# Patient Record
Sex: Female | Born: 1990 | Race: White | Hispanic: No | Marital: Single | Smoking: Never smoker
Health system: Southern US, Community
[De-identification: ages and names within clinical notes are randomized; demographics above are authoritative.]

## PROBLEM LIST (undated history)

## (undated) ENCOUNTER — Telehealth

## (undated) ENCOUNTER — Encounter

## (undated) ENCOUNTER — Encounter: Payer: PRIVATE HEALTH INSURANCE | Attending: Dermatology | Primary: Dermatology

## (undated) ENCOUNTER — Encounter
Attending: Student in an Organized Health Care Education/Training Program | Primary: Student in an Organized Health Care Education/Training Program

## (undated) ENCOUNTER — Telehealth: Attending: Dermatology | Primary: Dermatology

## (undated) ENCOUNTER — Ambulatory Visit

## (undated) ENCOUNTER — Ambulatory Visit: Payer: BLUE CROSS/BLUE SHIELD | Attending: Physician Assistant | Primary: Physician Assistant

## (undated) ENCOUNTER — Encounter: Attending: Dermatology | Primary: Dermatology

## (undated) ENCOUNTER — Ambulatory Visit: Attending: Dermatology | Primary: Dermatology

## (undated) ENCOUNTER — Ambulatory Visit: Payer: BLUE CROSS/BLUE SHIELD

## (undated) DIAGNOSIS — L732 Hidradenitis suppurativa: Secondary | ICD-10-CM

## (undated) DIAGNOSIS — E079 Disorder of thyroid, unspecified: Secondary | ICD-10-CM

## (undated) DIAGNOSIS — M1990 Unspecified osteoarthritis, unspecified site: Secondary | ICD-10-CM

## (undated) HISTORY — PX: MANDIBLE FRACTURE SURGERY: SHX706

## (undated) HISTORY — PX: SINUS EXPLORATION: SHX5214

## (undated) HISTORY — DX: Disorder of thyroid, unspecified: E07.9

## (undated) HISTORY — DX: Hidradenitis suppurativa: L73.2

## (undated) HISTORY — DX: Unspecified osteoarthritis, unspecified site: M19.90

## (undated) MED ORDER — ESTRADIOL 0.01% (0.1 MG/GRAM) VAGINAL CREAM: 0.00000 days

## (undated) MED ORDER — CYCLOBENZAPRINE 5 MG TABLET: 0.00000 days

## (undated) MED ORDER — BUDESONIDE-FORMOTEROL HFA 80 MCG-4.5 MCG/ACTUATION AEROSOL INHALER: 0.00000 days

## (undated) MED ORDER — OLOPATADINE 0.6 % NASAL SPRAY: 0.00000 days

---
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
## 2017-05-14 ENCOUNTER — Emergency Department
Admission: EM | Admit: 2017-05-14 | Discharge: 2017-05-14 | Disposition: A | Discharge status: Home / Self Care | Payer: BC Managed Care – PPO | Source: Intra-hospital

## 2017-09-13 MED ORDER — SPIRONOLACTONE 100 MG TABLET
ORAL_TABLET | 1 refills | 0.00000 days | Status: CP
Start: 2017-09-13 — End: 2017-12-10

## 2017-12-10 ENCOUNTER — Encounter: Admit: 2017-12-10 | Discharge: 2017-12-11 | Payer: PRIVATE HEALTH INSURANCE

## 2017-12-10 DIAGNOSIS — L708 Other acne: Principal | ICD-10-CM

## 2017-12-10 DIAGNOSIS — L910 Hypertrophic scar: Secondary | ICD-10-CM

## 2017-12-10 DIAGNOSIS — L732 Hidradenitis suppurativa: Secondary | ICD-10-CM

## 2017-12-10 MED ORDER — SPIRONOLACTONE 100 MG TABLET
ORAL_TABLET | Freq: Every day | ORAL | 1 refills | 0.00000 days | Status: CP
Start: 2017-12-10 — End: 2018-02-04

## 2018-02-04 MED ORDER — SPIRONOLACTONE 100 MG TABLET
ORAL_TABLET | Freq: Every day | ORAL | 1 refills | 0.00000 days | Status: CP
Start: 2018-02-04 — End: 2018-07-15

## 2018-02-19 ENCOUNTER — Encounter: Admit: 2018-02-19 | Discharge: 2018-02-20 | Payer: PRIVATE HEALTH INSURANCE

## 2018-02-19 DIAGNOSIS — L710 Perioral dermatitis: Secondary | ICD-10-CM

## 2018-02-19 DIAGNOSIS — L732 Hidradenitis suppurativa: Principal | ICD-10-CM

## 2018-02-19 DIAGNOSIS — L910 Hypertrophic scar: Secondary | ICD-10-CM

## 2018-02-19 MED ORDER — AZITHROMYCIN 250 MG TABLET
ORAL | 2 refills | 0.00000 days | Status: CP
Start: 2018-02-19 — End: 2018-05-20

## 2018-07-15 MED ORDER — SPIRONOLACTONE 100 MG TABLET
ORAL_TABLET | Freq: Every day | ORAL | 1 refills | 0.00000 days | Status: CP
Start: 2018-07-15 — End: 2019-01-12

## 2019-01-12 MED ORDER — SPIRONOLACTONE 100 MG TABLET
ORAL_TABLET | 3 refills | 0.00000 days | Status: CP
Start: 2019-01-12 — End: ?

## 2019-01-27 MED ORDER — KETOCONAZOLE 2 % SHAMPOO
TOPICAL | 11 refills | 0.00000 days | Status: CP
Start: 2019-01-27 — End: ?

## 2019-03-13 ENCOUNTER — Encounter
Admit: 2019-03-13 | Discharge: 2019-03-14 | Payer: PRIVATE HEALTH INSURANCE | Attending: Dermatology | Primary: Dermatology

## 2019-03-13 DIAGNOSIS — L409 Psoriasis, unspecified: Principal | ICD-10-CM

## 2019-03-13 MED ORDER — CLOBETASOL 0.05 % SCALP SOLUTION
Freq: Two times a day (BID) | TOPICAL | 3 refills | 0.00000 days | Status: CP
Start: 2019-03-13 — End: 2020-03-12

## 2019-04-09 ENCOUNTER — Encounter
Admit: 2019-04-09 | Discharge: 2019-04-10 | Payer: PRIVATE HEALTH INSURANCE | Attending: Dermatology | Primary: Dermatology

## 2019-04-09 DIAGNOSIS — L409 Psoriasis, unspecified: Secondary | ICD-10-CM

## 2019-04-09 DIAGNOSIS — Z79899 Other long term (current) drug therapy: Principal | ICD-10-CM

## 2019-04-09 DIAGNOSIS — L710 Perioral dermatitis: Secondary | ICD-10-CM

## 2019-04-09 MED ORDER — METRONIDAZOLE 0.75 % TOPICAL CREAM
Freq: Two times a day (BID) | TOPICAL | 6 refills | 0.00000 days | Status: CP
Start: 2019-04-09 — End: ?

## 2019-04-09 MED ORDER — HUMIRA 40 MG/0.8 ML SUBCUTANEOUS SYRINGE KIT
SUBCUTANEOUS | 4 refills | 0.00000 days | Status: CP
Start: 2019-04-09 — End: 2019-04-14

## 2019-04-09 MED ORDER — HUMIRA 40 MG/0.8 ML SUBCUTANEOUS SYRINGE KIT: each | 4 refills | 0 days | Status: AC

## 2019-04-10 LAB — HIV ANTIGEN/ANTIBODY COMBO: HIV 1+2 Ab+HIV1 p24 Ag:PrThr:Pt:Ser/Plas:Ord:IA: NONREACTIVE

## 2019-04-10 LAB — HEPATITIS B CORE TOTAL ANTIBODY: Hepatitis B virus core Ab:PrThr:Pt:Ser/Plas:Ord:IA: NONREACTIVE

## 2019-04-10 LAB — HEPATITIS C ANTIBODY
HEPATITIS C ANTIBODY: NONREACTIVE
Hepatitis C virus Ab:PrThr:Pt:Ser:Ord:: NONREACTIVE

## 2019-04-10 LAB — HEPATITIS B SURFACE ANTIBODY: HEPATITIS B SURFACE ANTIBODY QUANT: >1000 m[IU]/mL — ABNORMAL HIGH (ref <8.00)

## 2019-04-10 LAB — HEPATITIS B SURFACE ANTIGEN: Hepatitis B virus surface Ag:PrThr:Pt:Ser:Ord:: NONREACTIVE

## 2019-04-10 LAB — HEPATITIS B SURFACE ANTIBODY QUANT: Hepatitis B virus surface Ab:ACnc:Pt:Ser:Qn:: >1000 — ABNORMAL HIGH

## 2019-04-10 NOTE — Unmapped (Signed)
Per test claim for HUMIRA at the [facility], patient needs Medication Assistance Program for Prior Authorization.

## 2019-04-10 NOTE — Unmapped (Signed)
lvm

## 2019-04-14 LAB — TB MITOGEN VALUE: Lab: 6.06

## 2019-04-14 LAB — QUANTIFERON TB GOLD PLUS
Lab: NEGATIVE
QUANTIFERON MITOGEN: 6.04 [IU]/mL
QUANTIFERON TB GOLD PLUS: NEGATIVE
QUANTIFERON TB NIL VALUE: 0.02 [IU]/mL

## 2019-04-14 LAB — TB AG1 VALUE: Lab: 0.09

## 2019-04-14 LAB — TB AG2 VALUE: Lab: 0.08

## 2019-04-14 LAB — TB NIL VALUE: Lab: 0.02

## 2019-04-14 MED ORDER — HUMIRA 40 MG/0.8 ML SUBCUTANEOUS SYRINGE KIT
SUBCUTANEOUS | 4 refills | 7.00000 days | Status: CP
Start: 2019-04-14 — End: 2019-04-27

## 2019-04-14 NOTE — Unmapped (Signed)
Received VM from Teresa Butler saying her pharmacy was missing information on the prescription. I called [phone] per her request. I called [facility]. They verified the prescription with me and everything was correct so not sure what the issue was. I will call Prentiss and let her know.

## 2019-04-15 NOTE — Unmapped (Signed)
Patient labs stable. Patient notified via mychart.

## 2019-04-15 NOTE — Unmapped (Signed)
I took care of this yesterday as Ruby had also left a VM on the nurse line. Your prescription was fine. I don't know what their issue was, but I verified everything.

## 2019-04-22 NOTE — Unmapped (Signed)
Teresa Butler wants to know if she could switch to the citrate free Humira pens.

## 2019-04-24 MED ORDER — HUMIRA PEN CITRATE FREE STARTER PACK FOR PS/UV 1X 80 MG/0.8 ML, 2X 40 MG/0.4 ML
PACK | ORAL | 0 refills | 0.00000 days | Status: CP
Start: 2019-04-24 — End: 2019-04-27

## 2019-04-24 MED ORDER — HUMIRA PEN CITRATE FREE 40 MG/0.4 ML
SUBCUTANEOUS | 6 refills | 28.00000 days | Status: CP
Start: 2019-04-24 — End: 2019-04-27

## 2019-04-24 NOTE — Unmapped (Signed)
Does the citrate free come in a syringe? Teresa Butler prefers the syringes.

## 2019-04-27 MED ORDER — ADALIMUMAB SYRINGE CITRATE FREE 40 MG/0.4 ML
INJECTION | SUBCUTANEOUS | 3 refills | 0.00000 days | Status: CP
Start: 2019-04-27 — End: ?

## 2019-04-27 NOTE — Unmapped (Signed)
Just wanted to send you this NVM note in case you were working on this.  Thanks, Fannie Knee

## 2019-04-28 NOTE — Unmapped (Signed)
Received fax from [facility] saying Marcelle requested replacement pen. It requires your signature. Will place in your mailbox.

## 2019-05-05 NOTE — Unmapped (Signed)
Call from patient.  States she was asked by [facility] to call Dr. Carles Collet to have her call them with one time order for replacement Humira pen.  Patient says she needs it by next Wednesday when her next inj is due.  Phone number for [facility] is [phone].

## 2019-05-06 NOTE — Unmapped (Signed)
I called [facility] and gave a verbal order for Fonda to receive her replacement Humira.

## 2019-05-07 NOTE — Unmapped (Signed)
Patient called in on nurse line asking if Humira needs to be held prior to surgery she is to have to remove hardware in jaw.

## 2019-06-10 NOTE — Unmapped (Signed)
Per Danne Harbor and Dr. Carles Collet, I called [facility] at [phone]. They had a processed claim of 2 syringes sent on July 14th and 2 syringes sent on July 27th. They said the next shipment was not due until September 29th so a PLE was note needed as the shipment was requested too soon.     I went ahead and initiated the PA since her current one expires in less than 30 days. Verbal PA initiated for Humira. Approved through 06/09/20. Case #[id number].    She then transferred me to [facility] so I could discuss the shipment issue. The rep says she received 2 syringes on July 21st and 2 syringes on July 29th. This would still make her short one dose if she doesn't get a shipment until September 29th. Rep requested a new refill. She said it will take 24-48 hours to process this request.      Will send Mercer a MyChart message to update her on this.    Overall time on this phone call was 30 minutes.

## 2019-06-25 NOTE — Unmapped (Signed)
PA initiated for Humira 40MG /0.8ML syringe kit via CMM. Key: [id number] ??? PA Case ID: [id number]

## 2019-07-15 NOTE — Unmapped (Signed)
Voicemail left to call and schedule appt with Dr.Pearlstein. See Recall.

## 2019-08-05 DIAGNOSIS — L409 Psoriasis, unspecified: Principal | ICD-10-CM

## 2019-08-05 NOTE — Unmapped (Signed)
Called, left voicemail

## 2019-08-06 MED ORDER — ADALIMUMAB SYRINGE CITRATE FREE 40 MG/0.4 ML
INJECTION | SUBCUTANEOUS | 3 refills | 0.00000 days | Status: CP
Start: 2019-08-06 — End: ?

## 2019-10-26 ENCOUNTER — Encounter: Admit: 2019-10-26 | Discharge: 2019-10-27 | Payer: PRIVATE HEALTH INSURANCE

## 2019-10-26 DIAGNOSIS — Z79899 Other long term (current) drug therapy: Principal | ICD-10-CM

## 2019-10-26 DIAGNOSIS — L732 Hidradenitis suppurativa: Principal | ICD-10-CM

## 2019-10-26 DIAGNOSIS — L409 Psoriasis, unspecified: Principal | ICD-10-CM

## 2019-10-26 DIAGNOSIS — L299 Pruritus, unspecified: Principal | ICD-10-CM

## 2019-10-26 MED ORDER — GABAPENTIN 300 MG CAPSULE
ORAL_CAPSULE | Freq: Three times a day (TID) | ORAL | 3 refills | 60 days | Status: CP
Start: 2019-10-26 — End: ?

## 2019-10-26 MED ORDER — DOXEPIN 25 MG CAPSULE
ORAL_CAPSULE | Freq: Every evening | ORAL | 3 refills | 30 days | Status: CP
Start: 2019-10-26 — End: ?

## 2019-10-26 MED ORDER — ADALIMUMAB SYRINGE CITRATE FREE 40 MG/0.4 ML
INJECTION | 11 refills | 0 days | Status: CP
Start: 2019-10-26 — End: ?

## 2019-10-26 NOTE — Unmapped (Signed)
DERMATOLOGY FOLLOW-UP NOTE    Assessment and Plan:    Severe scalp psoriasis - failed clobetasol solution and improved with adalimumab 40mg  eow, but still has thick scale on some areas of the scalp  -increase Humira to 40mg  weekly, side effects reviewed     High risk medication use - humira  Discussed risks of infection, potential risk of malignancy, injection site reactions. No hx of CHF, MS, lupus, malignancy, hepatitis, TB. 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 Pt does not have sensation in this area related to hx of head injury/surgery  - start metroNIDAZOLE (METROCREAM) 0.75 % cream; Apply topically Two (2) times a day.  Dispense: 60 g; Refill:   -when flaring will plan to swab for hsv. Pt does not want to try empiric valtrex    Hidradenitis suppurativa and acne - well controlled on spironolactone 100 mg daily and adalimumab as above    Diffuse itching: increase gabapentin to 300mg  po twice daily, side effects reviewed  -doxepin 25mg  at bedtime prn      RTC 4 months  ______________________________________________________________________    CC:    Chief Complaint   Patient presents with   ??? HS     doing well in groin area and back of legs take humira not sure to cint spiralactone   ??? Psoriasis     humira refilled scalp doing good had bad flare a few weeks ago         HPI:  This is a pleasant 29 y.o. female last seen by myself a few weeks ago here for f/up of psoriasis  -scalp improved with adalimumab 40mg  eow, but still has thick areas that are tender on occiput. Clobetasol solution was not helpful.  -Also gets an intermittent rash on her chin. 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 States it occur every time after direct sunlight and always in the same location. Had an aerobic culture with her PCP which showed skin flora per patinet.    -states HS is well controlled with her spironolactone, acne also doing well with it  -also points out diffuse itching without rash most of the time on arms, legs, trunk.  Starting allergy shots and uses Xyzal daily, but doesn't seem to help the itch.  Also taking gabapentin 100mg  daily for unrelated issues without relief.    Pertinent PMH:  Reviewed in Epic      ROS: Baseline state of health.  Denies fevers, chills. No other skin complaints except noted per HPI.     PE:  Gen: WD, WN, NAD, A&O  Skin: Per patient request, examination of the head, neck, chest, back, abdomen, bilateral upper extremities,and bilateral lower extremities was performed and significant for the below. All other areas examined were normal or had no significant findings.   Scaly erythematous well-demarcated plaques on the occipital scalp  Photo reviewed of many of grouped pustules on the entire chin

## 2019-10-26 NOTE — Unmapped (Signed)

## 2019-11-03 DIAGNOSIS — L409 Psoriasis, unspecified: Principal | ICD-10-CM

## 2019-11-03 NOTE — Unmapped (Signed)
The provider has sent this medication to the pharmacy with 11 refills on 10/26/19. I will contact the patient and let her know she has further refills.

## 2019-11-05 NOTE — Unmapped (Signed)
PA approval for humira through 11/04/22.

## 2019-11-06 NOTE — Unmapped (Signed)
I'll respond as her PA was approved.

## 2019-11-11 ENCOUNTER — Ambulatory Visit: Admit: 2019-11-11 | Discharge: 2019-11-12 | Payer: PRIVATE HEALTH INSURANCE

## 2019-11-11 DIAGNOSIS — L732 Hidradenitis suppurativa: Principal | ICD-10-CM

## 2019-11-11 NOTE — Unmapped (Signed)
DERMATOLOGY CLINIC NOTE      A/P:    Hidradenitis suppurativa - well controlled today  - cont humira 40mg  weekly for psoriasis - likely also helping with HS  - laser today as below    Laser Procedure Note:   Medically necessary due to scar, pain with functional impairment during flares. Clear improvement with prior treatment.  10th treatment in groin,   9th treatment in axillae,   5th treatment on posterior thighs  Treatment carried out with laser today. ??Risks, benefits, alternatives, and expected numbder of treatments were discussed. Risks discussed included the low risk of scarring, brusing, discomfort, pigmentary alteration, erosion, or ulceration and verbal informed consent was obtained. ??  ??  Post-care instructions including careful sun-avoidance was discussed. ??Eye protection was placed.  ??  Sites treated: Inguinal folds, mons pubis, labia, bilateral axillae  Laser used: Alexandrite  Pulse duration: 5  Energy in J/cm2: 10 (11 for labia)  Spot size in mm2: 24  Size >50cm2      Return in about 6 weeks (around 12/23/2019) for Alex laser. or sooner as needed      CC:  Chief Complaint   Patient presents with   ??? HS       HPI:  29 y.o. female last seen by Dr Janyth Contes on 10/26/2019.  They present today for follow up.     Concerns today include:    Would like to resume laser for HS. Has done multiple sessions in the past and found it very efficacious. No major flares in recent past, though has noticed a difference in the months she has not been doing laser.    Also recently increased frequency of humira to 40mg  weekly for psoriasis and tolerating well      How often in pain: less than monthly  Requiring pain medication? NO  Frequency of bandage changes? no drainage or less than weekly  Drainage: not answered  >25% worsening in last 3 months? NO   If no, when was it last markedly worse? within last year  How often getting new lesions? less than monthly  Number of inflammatory lesions monthly: 1-3    DLQI: 2    Any new health conditions since last visit? asthma / eczema / allergies    How helpful is the current treatment in managing the following aspects of your disease?  Pain: Very helpful  Decreasing length of flares: Very helpful  Decreasing new lesions: Very helpful  Drainage: Very helpful  Decreasing frequency of flares: Very helpful  Decreasing severity of flares: Very helpful  Odor: Very helpful          The patient denies any other new or changing lesions or areas of concern.     Pertinent PMH: Reviewed in Epic.     Patient Active Problem List   Diagnosis   ??? Allergic rhinitis due to pollen   ??? Chronic maxillary sinusitis   ??? Chronic sinusitis   ??? Hip pain, bilateral   ??? Infective otitis externa   ??? Knee pain, bilateral   ??? Migraine without status migrainosus, not intractable   ??? Nasal obstruction   ??? S/P nasal septoplasty   ??? S/P tonsillectomy   ??? Tonsillitis   ??? Tonsillitis, chronic   ??? Post concussive syndrome   ??? Cocaine use, unspecified, uncomplicated   ??? Alcohol use   ??? Benzodiazepine misuse   ??? Emotional lability   ??? Head injury   ??? Major depressive disorder  ROS: Baseline state of health. No fever, no other skin lesions except as noted in HPI    PE:  General: Well-developed, well-nourished, in no acute distress  Neuro: Alert and oriented, interacts appropriately  Mouth: not examined as patient wearing a mask  Ext: no inflammatory or dystrophic changes of nails  Skin: Per patient request, focused examination with inspection and palpation of the head, neck, abdomen,  buttocks,  external genitalia,  right upper extremity, left upper extremity, right lower extremity, left lower extremity, was performed and notable for the following. All other areas examined were normal or had no significant findings.  - axillae clear with reduced density of hair  - noninflamed nodules in inguinal creases

## 2019-11-11 NOTE — Unmapped (Addendum)
Click Here to Visit [facility] Covid-19 Vaccine Hub  Get the latest facts on the COVID-19 vaccines.      Or visit [facility]???s COVID-19 Vaccine Hub at [email or web address] to review the latest facts about the vaccines.      It was nice to see you today! Your resident doctor was Dr. Harrison Mons. Please call our clinic at [phone] with any concerns. We look forward to seeing you again!    If any of your medications are too expensive, look for a coupon at [email or web address].  - Enter the medication name, size, and your zip code to find coupons for local pharmacies.   - Print a coupon and bring it to the pharmacy, or pull up the coupon on a smartphone.  - You can also call pharmacies to ask about the cost of your medication before you pick it up.  If you still cannot afford your medication or have any trouble getting them, please let us know.    Laser Hair Reduction     Types of Lasers and How They Work   Lasers send a low-energy light beam through the skin that is absorbed by dark pigment (melanin) present in the shaft of the hair follicles. Since hair cycles as it grows, repeated treatments are necessary to destroy about 80% of the hairs. Different types of lasers may be used. The ruby, alexandrite, and diode were the first lasers approved for hair reduction. The intense pulsed light (IPL) system is also used. These lasers work best on light-skinned, dark-haired individuals because dark pigments in the surrounding skin cannot absorb the light they emit. Therefore, we use lasers with longer wavelengths, such as the Nd:YAG lasers, to treat darker skin types including African-american skin.     Laser Consultation   At the laser consultation, the physician will determine the appropriate laser/light source and treatment settings based on:   -Skin type (i.e. Your sensitivity to sun exposure)   -Hair color   -Thickness and location of hair   -Presence of tan (we will not apply a laser or light source to sun-tanned skin) -Previous hair removal methods you have tried   -Medical history: ovarian or thyroid disease, medications, history of abnormal scarring, history of cold sore (herpes simplex) outbreaks in the treatment area, or past isotretinoin use   -Existence of tattoos or mole sin the treatment area   Multiple treatment sessions (typically 5-8) will be needed for long-term results and there is a potential need for maintenance treatments (patients with lightly pigmented hair as opposed to dark hairs), and the possibility of variable responses to treatment (not all patients respond to laser therapy).     Pretreatment Instructions   Before treatment, avoid tanning and sunless tanners. Broad-spectrum (UVA/UVB) sunscreens with SPF 30 or higher should be used. No plucking, waxing, or electrolysis should be done, although shaving or depilatory creams can be used. The site to be treated should be shaved one to two days prior to laser treatment. A prophylactic (protective) oral antiviral medication may be started on the day prior to treatment to suppress the possibility of developing a herpes simplex infection in the treatment area (please tell your physician if you have frequent herpes outbreaks). An oral antibiotic may be prescribed if the nasal or perianal skin is to be treated.     Laser Treatment   On the day of treatment, the area should be clean and free of cosmetics. A cooling device in the form of a cool gel or refrigerant  spray will be used to lessen discomfort and protects the skin from excessive heating as well as the potential of skin darkening or lightening. Everyone in the room must wear protective eyewear during the laser procedure. Darker hair responds better than lighter hair (white, gray, or red). The laser pulses will feel like the snapping of a rubber band or warm pinpricks.     After Treatment Instructions   A small amount of swelling and redness around the hair follicles typically appears within minutes. Ice packs may be applied to the skin following treatment, and over-the-counter pain relief medicine (Tylenol) may be taken as needed. If blistering occurs, notify your doctor and a topical antibiotic ointment should be applied twice daily until healed. A mild topical steroid cream may be applied to reduce swelling and redness if requested. You should avoid sun-exposure and to use a broad-spectrum (UVA/UVB) sunscreen with SPF 30 after each laser treatment. Cosmetics may be applied to the treated skin immediately after the procedure.     Side Effects   Side effects of laser hair removal treatments may include pain, perifollicular edema (swelling around the hair follicle), and erythema (redness and inflammation) lasting one to three days. Blistering, herpes simplex outbreaks, and bacterial infections also can occur. Temporary skin lightening or darkening, especially in darker skin types may also be seen. However, permanent skin pigment change or scarring is very rare. Loss of freckles or lightening of moles in the treatment area may occur, as can darkening or lightening of tattoos.     Efficacy   The percentage of hairs removed per session by body location, with areas of thin skin (bikini and armpits) generally showing a better response than thick skin (back and chin). Approximately 10-25 percent reduction in hair can be expected after each treatment. Treatments are repeated every four to eight weeks and hair that re-grows tends to be lighter and finer in texture.

## 2019-11-12 NOTE — Unmapped (Signed)
I saw and evaluated the patient, participating in the key elements of the service.  I discussed the findings, assessment and plan with the resident and agree with resident’s findings and plan as documented in the resident's note.  I was immediately available for the entirety of the procedure(s) and present for the key and critical portions. Shantell Belongia J Eithel Ryall, MD

## 2019-12-22 ENCOUNTER — Encounter: Admit: 2019-12-22 | Discharge: 2019-12-23 | Payer: PRIVATE HEALTH INSURANCE

## 2019-12-22 DIAGNOSIS — L732 Hidradenitis suppurativa: Principal | ICD-10-CM

## 2019-12-22 NOTE — Unmapped (Signed)
DERMATOLOGY CLINIC NOTE      A/P:    Hidradenitis suppurativa - well controlled today  - cont humira 40mg  weekly for psoriasis - likely also helping with HS  - laser today as below    Laser Procedure Note:   Medically necessary due to scar, pain with functional impairment during flares. Clear improvement with prior treatment.  11th treatment in groin,   10th treatment in axillae,   6th treatment on posterior thighs  Treatment carried out with laser today. Risks, benefits, alternatives, and expected numbder of treatments were discussed. Risks discussed included the low risk of scarring, brusing, discomfort, pigmentary alteration, erosion, or ulceration and verbal informed consent was obtained. ??  ??  Post-care instructions including careful sun-avoidance was discussed. ??Eye protection was placed.  ??  Sites treated: Inguinal folds, mons pubis, labia, bilateral axillae, posterior thighs  Laser used: Alexandrite  Pulse duration: 5  Energy in J/cm2: 10 (11 for labia and posterior thighs)  Spot size in mm2: 24  Size >50cm2, >15 follicles destroyed    Return in about 8 weeks (around 02/16/2020) for laser hair removal. or sooner as needed      CC:  Laser for HS    HPI:  29 y.o. female last seen by Dr Janyth Contes in 2/21 for laser hair removal for HS and returns today for the same.    She reports that overall she has done well with the laser hair removal. She does have residual hair growth on the labia, more so than on other areas that have been treated. She also notes that she has had a few small flares on her posterior thighs, but overall has been doing well.    The patient denies any other new or changing lesions or areas of concern.     Pertinent PMH:   Psoriasis  HS    ROS: No fever or chills. Negative unless otherwise noted in the HPI.    PE:  General: Well-developed, well-nourished, in no acute distress  Neuro: Alert and oriented, interacts appropriately  Skin: Per patient request, focused examination with inspection and palpation of the abdomen,  buttocks,  external genitalia,  right upper extremity, left upper extremity, right lower extremity, left lower extremity, was performed and notable for the following. All other areas examined were normal or had no significant findings.  - axillae, mons, and upper thighs clear with reduced density of hair    The patient was seen and examined by Leonette Nutting, MD who agrees with the assessment and plan as above.

## 2019-12-23 NOTE — Unmapped (Signed)
I saw and evaluated the patient, participating in the key elements of the service.  I discussed the findings, assessment and plan with the resident and agree with resident’s findings and plan as documented in the resident's note.  I was immediately available for the entirety of the procedure(s) and present for the key and critical portions. Brittin Janik J Autumne Kallio, MD

## 2020-01-06 DIAGNOSIS — L708 Other acne: Principal | ICD-10-CM

## 2020-01-06 DIAGNOSIS — L732 Hidradenitis suppurativa: Principal | ICD-10-CM

## 2020-02-03 ENCOUNTER — Ambulatory Visit: Admit: 2020-02-03 | Discharge: 2020-02-04 | Payer: PRIVATE HEALTH INSURANCE

## 2020-02-03 NOTE — Unmapped (Signed)
DERMATOLOGY CLINIC NOTE      A/P:    Hidradenitis suppurativa--well controlled today:  - Continue humira 40mg  weekly for psoriasis, as likely also helping with HS.  - She recently discontinued Spironolactone due to finding out her estrogen levels were low.  - Continue with laser today as below.  - Will need repeat Quant gold at next visit. Last checked 04/2019.    Joint stiffness in hands:  - Present for 2 mornings only. Improves after 30 minutes - 1 hour.  - Unclear etiology, but given history of psoriasis, recommended monitoring and to contact us if it does not improve within several week.  - May consider lab testing and Rheumatology referral.    Laser Procedure Note:   Medically necessary due to scar, pain with functional impairment during flares. Clear improvement with prior treatment.  12th treatment in groin,   11th treatment in axillae,   7th treatment on posterior thighs  Treatment carried out with laser today. Risks, benefits, alternatives, and expected numbder of treatments were discussed. Risks discussed included the low risk of scarring, brusing, discomfort, pigmentary alteration, erosion, or ulceration and verbal informed consent was obtained. ??  ??  Post-care instructions including careful sun-avoidance was discussed. ??Eye protection was placed.  ??  Sites treated: Inguinal folds, mons pubis, labia, bilateral axillae, posterior thighs  Laser used: Alexandrite  Pulse duration: 5  Energy in J/cm2: 11 for axillae, 10 for labia, mons pubis, inguinal folds, medial and posterior thighs  Spot size in mm2: 24  Size >50cm2, >15 follicles destroyed    Return in about 8 weeks (around 03/30/2020) for Alex laser for HS. or sooner as needed      CC:  Alex laser for HS    HPI:  Teresa Butler is a 29 y.o. female who was last seen by Dr. Janyth Contes on 12/22/2019, and was seen today by Leonette Nutting, MD for f/u HS and Alex laser hair removal.    At last visit, she was continued on Humira 40 mg weekly and Alex laser hair removal. Today she reports HS has been stable. She is continuing with Humira 40 mg weekly. She has also been on Spironolactone, but this was discontinued 1.5 weeks ago due to her finding out her estrogen levels were low. She would like to continue with laser treatment today.    She also reports joint stiffness in her fingers over the past 2 mornings. This improves after 30 minutes - 1 hour. She is wondering if this is related to psoriasis or rheumatoid arthritis. No treatments or workup attempted.    The patient denies any other new or changing lesions or areas of concern.     Pertinent PMH:   Psoriasis  HS    ROS: No fever or chills. Negative unless otherwise noted in the HPI.      Physical Examination:  General: Well-developed, well-nourished female in no acute distress, resting comfortably.  Neuro: A&Ox3, answers questions appropriately.  Psych: Congruent mood and affect.  Skin: Per patient request, focused examination of the axillae, groin, thighs was performed and notable for the following:  - No active lesions. Continued thinning of hair density in treatment areas.  - No significant joint or finger swelling in hands.  - All other areas not specifically commented on are within normal limits.      The patient was seen and examined by Leonette Nutting, MD who agrees with the assessment and plan as above.

## 2020-02-03 NOTE — Unmapped (Signed)
I saw and evaluated the patient, participating in the key elements of the service.  I discussed the findings, assessment and plan with the resident and agree with resident’s findings and plan as documented in the resident's note.  I was immediately available for the entirety of the procedure(s) and present for the key and critical portions. Margareth Kanner J Syreeta Figler, MD

## 2020-02-08 DIAGNOSIS — M2560 Stiffness of unspecified joint, not elsewhere classified: Principal | ICD-10-CM

## 2020-02-08 DIAGNOSIS — L409 Psoriasis, unspecified: Principal | ICD-10-CM

## 2020-02-09 DIAGNOSIS — M2560 Stiffness of unspecified joint, not elsewhere classified: Principal | ICD-10-CM

## 2020-02-09 DIAGNOSIS — L409 Psoriasis, unspecified: Principal | ICD-10-CM

## 2020-03-07 MED ORDER — NITROFURANTOIN MONOHYDRATE/MACROCRYSTALS 100 MG CAPSULE
ORAL | 0.00000 days
Start: 2020-03-07 — End: ?

## 2020-03-17 MED ORDER — CLONAZEPAM 0.5 MG TABLET
ORAL | 0.00000 days
Start: 2020-03-17 — End: ?

## 2020-03-22 MED ORDER — HYDROXYZINE HCL 50 MG TABLET
ORAL | 0.00000 days
Start: 2020-03-22 — End: ?

## 2020-03-30 MED ORDER — NURTEC ODT 75 MG DISINTEGRATING TABLET
ORAL | 0.00000 days
Start: 2020-03-30 — End: ?

## 2020-04-06 MED ORDER — FLUOXETINE 20 MG CAPSULE
ORAL | 0.00000 days
Start: 2020-04-06 — End: ?

## 2020-04-09 ENCOUNTER — Inpatient Hospital Stay
Admission: RE | Admit: 2020-04-09 | Discharge: 2020-04-13 | DRG: 885 | Disposition: A | Discharge status: Home / Self Care | Payer: BC Managed Care – PPO | Source: Intra-hospital | Attending: Psychiatry | Admitting: Psychiatry

## 2020-04-09 ENCOUNTER — Emergency Department
Admission: EM | Admit: 2020-04-09 | Discharge: 2020-04-09 | Disposition: A | Discharge status: Other Acute Inpatient Hospital | Payer: BC Managed Care – PPO | Source: Home / Self Care | Attending: Emergency Medicine | Admitting: Emergency Medicine

## 2020-04-09 ENCOUNTER — Other Ambulatory Visit: Payer: Self-pay

## 2020-04-09 ENCOUNTER — Emergency Department: Payer: BC Managed Care – PPO

## 2020-04-09 ENCOUNTER — Encounter: Payer: Self-pay | Admitting: Psychiatry

## 2020-04-09 DIAGNOSIS — F329 Major depressive disorder, single episode, unspecified: Secondary | ICD-10-CM | POA: Insufficient documentation

## 2020-04-09 DIAGNOSIS — F909 Attention-deficit hyperactivity disorder, unspecified type: Secondary | ICD-10-CM | POA: Diagnosis present

## 2020-04-09 DIAGNOSIS — T1491XA Suicide attempt, initial encounter: Secondary | ICD-10-CM

## 2020-04-09 DIAGNOSIS — Y999 Unspecified external cause status: Secondary | ICD-10-CM | POA: Insufficient documentation

## 2020-04-09 DIAGNOSIS — Y9289 Other specified places as the place of occurrence of the external cause: Secondary | ICD-10-CM | POA: Insufficient documentation

## 2020-04-09 DIAGNOSIS — T71164A Asphyxiation due to hanging, undetermined, initial encounter: Secondary | ICD-10-CM

## 2020-04-09 DIAGNOSIS — Y939 Activity, unspecified: Secondary | ICD-10-CM | POA: Insufficient documentation

## 2020-04-09 DIAGNOSIS — M069 Rheumatoid arthritis, unspecified: Secondary | ICD-10-CM | POA: Diagnosis present

## 2020-04-09 DIAGNOSIS — G43909 Migraine, unspecified, not intractable, without status migrainosus: Secondary | ICD-10-CM | POA: Diagnosis present

## 2020-04-09 DIAGNOSIS — G4700 Insomnia, unspecified: Secondary | ICD-10-CM | POA: Diagnosis present

## 2020-04-09 DIAGNOSIS — T71162A Asphyxiation due to hanging, intentional self-harm, initial encounter: Secondary | ICD-10-CM | POA: Insufficient documentation

## 2020-04-09 DIAGNOSIS — Z79899 Other long term (current) drug therapy: Secondary | ICD-10-CM | POA: Diagnosis not present

## 2020-04-09 DIAGNOSIS — Z20822 Contact with and (suspected) exposure to covid-19: Secondary | ICD-10-CM | POA: Diagnosis present

## 2020-04-09 DIAGNOSIS — F4310 Post-traumatic stress disorder, unspecified: Secondary | ICD-10-CM | POA: Diagnosis present

## 2020-04-09 DIAGNOSIS — F411 Generalized anxiety disorder: Secondary | ICD-10-CM | POA: Diagnosis present

## 2020-04-09 DIAGNOSIS — F332 Major depressive disorder, recurrent severe without psychotic features: Secondary | ICD-10-CM | POA: Diagnosis not present

## 2020-04-09 LAB — COMPREHENSIVE METABOLIC PANEL
ALT: 20 U/L (ref 0–44)
AST: 23 U/L (ref 15–41)
Albumin: 5 g/dL (ref 3.5–5.0)
Alkaline Phosphatase: 52 U/L (ref 38–126)
Anion gap: 10 (ref 5–15)
BUN: 7 mg/dL (ref 6–20)
CO2: 24 mmol/L (ref 22–32)
Calcium: 9.7 mg/dL (ref 8.9–10.3)
Chloride: 102 mmol/L (ref 98–111)
Creatinine, Ser: 0.68 mg/dL (ref 0.44–1.00)
GFR calc Af Amer: >60 mL/min (ref >60)
GFR calc non Af Amer: >60 mL/min (ref >60)
Glucose, Bld: 99 mg/dL (ref 70–99)
Potassium: 3.2 mmol/L — ABNORMAL LOW (ref 3.5–5.1)
Sodium: 136 mmol/L (ref 135–145)
Total Bilirubin: 0.7 mg/dL (ref 0.3–1.2)
Total Protein: 8.4 g/dL — ABNORMAL HIGH (ref 6.5–8.1)

## 2020-04-09 LAB — URINE DRUG SCREEN, QUALITATIVE (ARMC ONLY)
Amphetamines, Ur Screen: NOT DETECTED
Barbiturates, Ur Screen: NOT DETECTED
Benzodiazepine, Ur Scrn: NOT DETECTED
Cannabinoid 50 Ng, Ur ~~LOC~~: NOT DETECTED
Cocaine Metabolite,Ur ~~LOC~~: NOT DETECTED
MDMA (Ecstasy)Ur Screen: NOT DETECTED
Methadone Scn, Ur: NOT DETECTED
Opiate, Ur Screen: NOT DETECTED
Phencyclidine (PCP) Ur S: NOT DETECTED
Tricyclic, Ur Screen: NOT DETECTED

## 2020-04-09 LAB — CBC
HCT: 40.6 % (ref 36.0–46.0)
Hemoglobin: 14.4 g/dL (ref 12.0–15.0)
MCH: 31.7 pg (ref 26.0–34.0)
MCHC: 35.5 g/dL (ref 30.0–36.0)
MCV: 89.4 fL (ref 80.0–100.0)
Platelets: 337 10*3/uL (ref 150–400)
RBC: 4.54 MIL/uL (ref 3.87–5.11)
RDW: 11.9 % (ref 11.5–15.5)
WBC: 9 10*3/uL (ref 4.0–10.5)
nRBC: 0 % (ref 0.0–0.2)

## 2020-04-09 LAB — SALICYLATE LEVEL: Salicylate Lvl: <7 mg/dL — ABNORMAL LOW (ref 7.0–30.0)

## 2020-04-09 LAB — PREGNANCY, URINE: Preg Test, Ur: NEGATIVE

## 2020-04-09 LAB — SARS CORONAVIRUS 2 BY RT PCR (HOSPITAL ORDER, PERFORMED IN ~~LOC~~ HOSPITAL LAB): SARS Coronavirus 2: NEGATIVE

## 2020-04-09 LAB — ETHANOL: Alcohol, Ethyl (B): <10 mg/dL (ref <10)

## 2020-04-09 LAB — TSH: TSH: 1.525 u[IU]/mL (ref 0.350–4.500)

## 2020-04-09 LAB — T4, FREE: Free T4: 1.02 ng/dL (ref 0.61–1.12)

## 2020-04-09 LAB — ACETAMINOPHEN LEVEL: Acetaminophen (Tylenol), Serum: <10 ug/mL — ABNORMAL LOW (ref 10–30)

## 2020-04-09 MED ORDER — FLUOXETINE HCL 10 MG PO CAPS
30.0000 mg | ORAL_CAPSULE | Freq: Every day | ORAL | Status: DC
  Filled 2020-04-09: qty 3

## 2020-04-09 MED ORDER — IOHEXOL 350 MG/ML SOLN
75.0000 mL | Freq: Once | INTRAVENOUS | Status: AC | PRN
  Administered 2020-04-09: 75 mL via INTRAVENOUS

## 2020-04-09 MED ORDER — CLONAZEPAM 0.5 MG PO TABS: 0.5000 mg | ORAL_TABLET | Freq: Three times a day (TID) | ORAL | Status: DC | PRN

## 2020-04-09 MED ORDER — ALUM & MAG HYDROXIDE-SIMETH 200-200-20 MG/5ML PO SUSP: 30.0000 mL | ORAL | Status: DC | PRN

## 2020-04-09 MED ORDER — KETOROLAC TROMETHAMINE 10 MG PO TABS
10.0000 mg | ORAL_TABLET | Freq: Once | ORAL | Status: AC
  Administered 2020-04-09: 10 mg via ORAL
  Filled 2020-04-09: qty 1

## 2020-04-09 MED ORDER — QUETIAPINE FUMARATE 25 MG PO TABS: 25.0000 mg | ORAL_TABLET | Freq: Every day | ORAL | Status: DC

## 2020-04-09 MED ORDER — HYDROXYZINE HCL 50 MG PO TABS
50.0000 mg | ORAL_TABLET | Freq: Three times a day (TID) | ORAL | Status: DC | PRN
  Administered 2020-04-09 – 2020-04-13 (×7): 50 mg via ORAL
  Filled 2020-04-09 (×9): qty 1

## 2020-04-09 MED ORDER — FLUOXETINE HCL 20 MG PO CAPS
20.0000 mg | ORAL_CAPSULE | Freq: Every day | ORAL | Status: DC
  Administered 2020-04-09: 20 mg via ORAL
  Filled 2020-04-09: qty 1

## 2020-04-09 MED ORDER — ALPRAZOLAM ER 0.5 MG PO TB24: 0.5000 mg | ORAL_TABLET | Freq: Three times a day (TID) | ORAL | Status: DC

## 2020-04-09 MED ORDER — FLUOXETINE HCL 20 MG PO CAPS: 20.0000 mg | ORAL_CAPSULE | Freq: Every day | ORAL | Status: DC

## 2020-04-09 MED ORDER — MAGNESIUM HYDROXIDE 400 MG/5ML PO SUSP
30.0000 mL | Freq: Every day | ORAL | Status: DC | PRN
  Administered 2020-04-12: 30 mL via ORAL
  Filled 2020-04-09: qty 30

## 2020-04-09 MED ORDER — CLONAZEPAM 0.5 MG PO TABS
0.5000 mg | ORAL_TABLET | Freq: Three times a day (TID) | ORAL | Status: DC | PRN
  Administered 2020-04-09 – 2020-04-12 (×7): 0.5 mg via ORAL
  Filled 2020-04-09 (×7): qty 1

## 2020-04-09 MED ORDER — TRAMADOL HCL 50 MG PO TABS: 50.0000 mg | ORAL_TABLET | Freq: Four times a day (QID) | ORAL | Status: DC | PRN

## 2020-04-09 MED ORDER — ACETAMINOPHEN 325 MG PO TABS: 650.0000 mg | ORAL_TABLET | Freq: Four times a day (QID) | ORAL | Status: DC | PRN

## 2020-04-09 MED ORDER — TRAMADOL HCL 50 MG PO TABS
50.0000 mg | ORAL_TABLET | Freq: Four times a day (QID) | ORAL | Status: DC | PRN
  Administered 2020-04-09 – 2020-04-12 (×4): 50 mg via ORAL
  Filled 2020-04-09 (×5): qty 1

## 2020-04-09 NOTE — ED Triage Notes (Signed)
Patient reports SI attempt this evening; tried to hang herself with shower curtain. Patient c/o neck pain; reports possible LOC after hanging.   Patient took 1 50 mg hydroxyzine and 1 0.5 mg klonopin.

## 2020-04-09 NOTE — Progress Notes (Signed)
Patient has been on the phone most of the shift. I transferred a phone call to the patient while she was on another call with someone else. She asked her nurse to call her boyfriend and transfer the call since it is long distance. Her nurse did as she asked and the boyfriend did not answer. She was back ten minutes later asking to call again. After not being able to reach her boyfriend she repeatedly asked staff to try to call him again. The patient became irate with her nurse after her nurse tried to set limits on the number of long distance calls she was making and said "I'm sorry he's not answering, we have no control over that, we cannot make him answer the phone". The patient later asked again after the phones were turned off and complained to staff about her needs not being met after she was told the phones were turned off for the night. The patient complained to this Pryor Curia asking to make a complaint about her nurse for raising her voice at her and berating her and calling her stupid. This Chief Strategy Officer overheard the exchange during medication pass and at no time did the patient's nurse raise her voice or call her stupid. She did comment that the patient did not seem to be processing the information that she was giving her because she continued to ask to make phone calls after limits were set and she was told no. She is insistent on asking her boyfriend to bring her Humira which is not due until Wednesday.

## 2020-04-09 NOTE — ED Provider Notes (Signed)
[facility] Emergency Department Provider Note  ____________________________________________   First MD Initiated Contact with Patient 04/09/20 0515     (approximate)  I have reviewed the triage vital signs and the nursing notes.   HISTORY  Chief Complaint Suicidal    HPI Brianna Peck is a 29 y.o. female with medical history as listed below who presents under involuntary commitment by law enforcement for a suicide attempt.  She said that she has had relatively recent acute onset of severe depression.  She is being worked up for autoimmune disease and the role that thyroid disease may play with her psychiatric symptoms.  She has been suffering from insomnia and has been compliant with her medication regimen says that nothing is helping.  Tonight she acutely decided to end her life.  She reports tying the "belt" from a bathrobe around her neck and hanging herself from a shower.  Apparently her boyfriend realized something was wrong and called law enforcement.  They went into the bathroom together and according to the sheriff's deputy who was in the emergency department, she was hanging and her face was blue.  They cut her down and she returned to normal.  It is unclear if she fully lost consciousness but she reports that she was "about to pass out".  She reports some pain in her neck and throat and keeps clearing her throat because it feels like something is bothering her.  She has no headache.  She denies nausea, vomiting, chest pain, shortness of breath, abdominal pain, and dysuria.  She admits that her symptoms are severe and nothing in particular is helping them or making them worse.  She denies any overdose attempts.         Past Medical History:  Diagnosis Date  . Arthritis   . Hydradenitis   . Thyroid disease     There are no problems to display for this patient.   Past Surgical History:  Procedure Laterality Date  . MANDIBLE FRACTURE SURGERY     . SINUS EXPLORATION      Prior to Admission medications   Not on File    Allergies Cefzil [cefprozil]  No family history on file.  Social History Social History   Tobacco Use  . Smoking status: Never Smoker  . Smokeless tobacco: Never Used  Substance Use Topics  . Alcohol use: Yes  . Drug use: Not on file    Review of Systems Constitutional: No fever/chills Eyes: No visual changes. ENT: Mild sore throat after hanging. Cardiovascular: Denies chest pain. Respiratory: Denies shortness of breath. Gastrointestinal: No abdominal pain.  No nausea, no vomiting.  No diarrhea.  No constipation. Genitourinary: Negative for dysuria. Musculoskeletal: Anterior neck pain.  Negative for back pain. Integumentary: Negative for rash. Neurological: Negative for headaches, focal weakness or numbness.   ____________________________________________   PHYSICAL EXAM:  VITAL SIGNS: ED Triage Vitals  Enc Vitals Group     BP 04/09/20 0514 (!) 129/92     Pulse Rate 04/09/20 0514 70     Resp 04/09/20 0514 18     Temp 04/09/20 0514 98.3 F (36.8 C)     Temp Source 04/09/20 0514 Oral     SpO2 04/09/20 0514 100 %     Weight 04/09/20 0508 50.8 kg (112 lb)     Height 04/09/20 0508 1.575 m (5\' 2" )     Head Circumference --      Peak Flow --      Pain Score 04/09/20 0508  2     Pain Loc --      Pain Edu? --      Excl. in GC? --     Constitutional: Alert and oriented.  Eyes: Conjunctivae are normal.  Pupils are equal and reactive. Head: Atraumatic. Nose: No congestion/rhinnorhea. Mouth/Throat: Patient is wearing a mask. Neck: No stridor.  No meningeal signs.  No tenderness to palpation of the anterior neck.  No crepitus, no ligature marks. Cardiovascular: Normal rate, regular rhythm. Good peripheral circulation. Grossly normal heart sounds. Respiratory: Normal respiratory effort.  No retractions. Gastrointestinal: Soft and nontender. No distention.  Musculoskeletal: No lower  extremity tenderness nor edema. No gross deformities of extremities. Neurologic:  Normal speech and language. No gross focal neurologic deficits are appreciated.  Skin:  Skin is warm, dry and intact. Psychiatric: Mood and affect are flat and blunted.  Depressed.  Admits to suicidal ideation and suicide attempt tonight.  ____________________________________________   LABS (all labs ordered are listed, but only abnormal results are displayed)  Labs Reviewed  COMPREHENSIVE METABOLIC PANEL - Abnormal; Notable for the following components:      Result Value   Potassium 3.2 (*)    Total Protein 8.4 (*)    All other components within normal limits  SALICYLATE LEVEL - Abnormal; Notable for the following components:   Salicylate Lvl <7.0 (*)    All other components within normal limits  ACETAMINOPHEN LEVEL - Abnormal; Notable for the following components:   Acetaminophen (Tylenol), Serum <10 (*)    All other components within normal limits  SARS CORONAVIRUS 2 BY RT PCR (HOSPITAL ORDER, PERFORMED IN [facility])  ETHANOL  CBC  TSH  T4, FREE  URINE DRUG SCREEN, QUALITATIVE ([facility] ONLY)  T3, FREE  POC URINE PREG, ED   ____________________________________________  EKG  No indication for emergent EKG ____________________________________________  RADIOLOGY I, Loleta Rose, personally viewed and evaluated these images (plain radiographs) as part of my medical decision making, as well as reviewing the written report by the radiologist.  ED MD interpretation:  No acute abnormalities identified on CT imaging of the neck.  Official radiology report(s): CT Angio Neck W and/or Wo Contrast  Result Date: 04/09/2020 CLINICAL DATA:  Neck trauma due to attempted hanging EXAM: CT ANGIOGRAPHY NECK TECHNIQUE: Multidetector CT imaging of the neck was performed using the standard protocol during bolus administration of intravenous contrast. Multiplanar CT image reconstructions and MIPs were  obtained to evaluate the vascular anatomy. Carotid stenosis measurements (when applicable) are obtained utilizing NASCET criteria, using the distal internal carotid diameter as the denominator. CONTRAST:  59mL OMNIPAQUE IOHEXOL 350 MG/ML SOLN COMPARISON:  None. FINDINGS: Aortic arch: Normal with 3 vessel branching. Right carotid system: Vessels are smooth and widely patent. No evidence of injury. Left carotid system: Vessels are smooth and widely patent. No evidence of injury. Vertebral arteries: No proximal subclavian stenosis. The vertebral arteries are smooth and widely patent. Skeleton: Postoperative mandible. No evidence of cervical spine or hyoid fracture. Other neck: Negative for hematoma. Upper chest: Negative for apical pneumothorax. IMPRESSION: Negative neck CTA. Electronically Signed   By: Marnee Spring M.D.   On: 04/09/2020 07:34   CT C-SPINE NO CHARGE  Result Date: 04/09/2020 CLINICAL DATA:  Neck pain after attempted hanging EXAM: CT CERVICAL SPINE WITH CONTRAST TECHNIQUE: Cervical spine reformats were generated from CTA of the neck images. No additional contrast was used. COMPARISON:  None. FINDINGS: Alignment: Normal Skull base and vertebrae: No acute cervical spine fracture. Postoperative mandible and  mid face. Bilateral mandibular angle screws extend through both sides of the cortex with a bony fragment associated with the screw tips on the left, chronic appearing Soft tissues and spinal canal: No evidence of hematoma or laryngeal injury. Disc levels:  No degenerative changes. Upper chest: Negative IMPRESSION: No evidence of cervical spine injury. Electronically Signed   By: Marnee Spring M.D.   On: 04/09/2020 07:37    ____________________________________________   PROCEDURES   Procedure(s) performed (including Critical Care):  Procedures   ____________________________________________   INITIAL IMPRESSION / MDM / ASSESSMENT AND PLAN / ED COURSE  As part of my medical  decision making, I reviewed the following data within the electronic MEDICAL RECORD NUMBER Nursing notes reviewed and incorporated, Labs reviewed , Old chart reviewed, Patient signed out to Dr. Darnelle Catalan, A consult was requested and obtained from this/these consultant(s) Psychiatry and Notes from prior ED visits   Differential diagnosis includes, but is not limited to, vascular neck injury, cervical spine injury, strangulation, depression, thyroid disease, medication or drug side effect.  The patient reportedly had a significant mechanism of injury to her neck.  Although she has no external signs of expanding neck hematoma or edema ecchymosis, it is worthwhile to rule out any vascular injury such as carotid dissection as well as to look for structural abnormalities that could have happened as result of the ligature.  She says that she takes birth control and I feel it is important to obtain the imaging in the next fillable opportunity so we will proceed without labs once the CT scanner is available and I spoke with Pam (CT technologist) about this plan.  I am upholding the patient's involuntary commitment orders and she will need psychiatry consultation after acute injury is ruled out.     Clinical Course as of Apr 09 753  Sat Apr 09, 2020  0613 Acetaminophen (Tylenol), S(!): <10 [CF]  0613 Salicylate Lvl(!): <7.0 [CF]  0719 SARS Coronavirus 2: NEGATIVE [CF]  0752 No acute abnormalities identified on CTA neck and C-spine imaging.  Patient medically cleared for psychiatric evaluation and disposition.   [CF]  [phone] The patient has been placed in psychiatric observation due to the need to provide a safe environment for the patient while obtaining psychiatric consultation and evaluation, as well as ongoing medical and medication management to treat the patient's condition. The patient has been placed under full IVC at this time.     [CF]    Clinical Course User Index [CF] Loleta Rose, MD      ____________________________________________  FINAL CLINICAL IMPRESSION(S) / ED DIAGNOSES  Final diagnoses:  Hanging, initial encounter  Suicidal behavior with attempted self-injury [facility])     MEDICATIONS GIVEN DURING THIS VISIT:  Medications  iohexol (OMNIPAQUE) 350 MG/ML injection 75 mL (75 mLs Intravenous Contrast Given 04/09/20 9528)     ED Discharge Orders    None      *Please note:  Raylin Waitkus was evaluated in Emergency Department on 04/09/2020 for the symptoms described in the history of present illness. She was evaluated in the context of the global COVID-19 pandemic, which necessitated consideration that the patient might be at risk for infection with the SARS-CoV-2 virus that causes COVID-19. Institutional protocols and algorithms that pertain to the evaluation of patients at risk for COVID-19 are in a state of rapid change based on information released by regulatory bodies including the CDC and federal and state organizations. These policies and algorithms were followed during the patient's care in the  ED.  Some ED evaluations and interventions may be delayed as a result of limited staffing during and after the pandemic.*  Note:  This document was prepared using Dragon voice recognition software and may include unintentional dictation errors.   Loleta Rose, MD 04/09/20 [phone]

## 2020-04-09 NOTE — Tx Team (Signed)
Initial Treatment Plan 04/09/2020 4:23 PM Cathe Bilger [id number]    PATIENT STRESSORS: Health problems Marital or family conflict   PATIENT STRENGTHS: Ability for insight Active sense of humor   PATIENT IDENTIFIED PROBLEMS: Suicidal Ideations  Ineffective coping skills                   DISCHARGE CRITERIA:  Ability to meet basic life and health needs Adequate post-discharge living arrangements  PRELIMINARY DISCHARGE PLAN: Return to previous living arrangement Return to previous work or school arrangements  PATIENT/FAMILY INVOLVEMENT: This treatment plan has been presented to and reviewed with the patient, Brianna Peck.  The patient and family have been given the opportunity to ask questions and make suggestions.  Berkley Harvey, RN 04/09/2020, 4:23 PM

## 2020-04-09 NOTE — ED Notes (Signed)
Patient alert and oriented, warm and dry, in no acute distress. Patient denies SI, HI, AVH and pain. Patient made aware of Q15 minute rounds and Rover and Officer presence for their safety. Patient instructed to come to me with needs or concerns.  

## 2020-04-09 NOTE — ED Notes (Signed)
This RN changed patient into hospital provided scrubs with michele RN and Gregor Hams as witnesses. Patient's belonging's placed into labeled bag. Patient's belonging's include:  Shirt, shorts, flip flops, underwear, cell phone.

## 2020-04-09 NOTE — Progress Notes (Signed)
Patient ID: Brianna Peck, female   DOB: 30-May-1991, 29 y.o.   MRN: [id number]  D: Patient is newly admitted to BMU. Patient's skin assessment completed with Megan RN, skin is intact, no contraband found. Patient denies SI/HI/AVH. Patient reports no HX of ETOH / Smoking. Patient reports current stressors of Recent suicide attempt, inability to sleep, recent break up with boyfriend, health issues.  A: Patient oriented to unit/room/call light. Patient was offered support and encouragement. Patient was encourage to attend groups, participate in unit activities and continue with plan of care. Q x 15 minute observation checks were completed for safety.   R: Patient has no complaints at this time. Patient is receptive to treatment and safety maintained on unit.

## 2020-04-09 NOTE — ED Notes (Signed)
Pt requested water, this edt provided
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
## 2020-04-09 NOTE — ED Notes (Signed)
Pt given the phone to use.

## 2020-04-09 NOTE — ED Notes (Signed)
Report received from [facility]. Pt is currently asleep at this time. Respirations are equal and unlabored. Pt in NAD at this time.
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
## 2020-04-09 NOTE — BH Assessment (Signed)
Patient is to be admitted to [facility] by Dr. Toni Amend.  Attending Physician will be Dr. Toni Amend.   Patient has been assigned to room 316, by [facility] Charge Nurse Megan.   Intake Paper Work has been signed and placed on patient chart.  ER staff is aware of the admission:  Ronnie, ER Secretary    Dr. Darnelle Catalan, ER MD   Morrie Sheldon Patient's Nurse   Alyssa Patient Access.

## 2020-04-09 NOTE — ED Notes (Signed)
Pt c/o migraine. Pt states that she usually takes Zyrtec or Toradol. Dr. Darnelle Catalan, MD notified. See orders.

## 2020-04-09 NOTE — ED Notes (Addendum)
Hourly rounding reveals patient sleeping in hall bed. No complaints, stable, in no acute distress. Q15 minute rounds and monitoring via Rover and Officer to continue.  

## 2020-04-09 NOTE — ED Notes (Signed)
Elder Love (Mom)- [phone] Beau (Brother)- [phone]

## 2020-04-09 NOTE — ED Notes (Signed)
Psychiatry at bedside.

## 2020-04-09 NOTE — ED Notes (Signed)
Pt awake and ambulated to the restroom without assistance. Pt is A&Ox4 and NAD at this time. Pt denies AVH and denies SI/HI at this time. Denies pain.

## 2020-04-10 DIAGNOSIS — G4700 Insomnia, unspecified: Secondary | ICD-10-CM

## 2020-04-10 DIAGNOSIS — F909 Attention-deficit hyperactivity disorder, unspecified type: Secondary | ICD-10-CM

## 2020-04-10 DIAGNOSIS — T71162A Asphyxiation due to hanging, intentional self-harm, initial encounter: Secondary | ICD-10-CM

## 2020-04-10 MED ORDER — FLUOXETINE HCL 20 MG PO CAPS
20.0000 mg | ORAL_CAPSULE | Freq: Every day | ORAL | Status: DC
  Administered 2020-04-10 – 2020-04-11 (×2): 20 mg via ORAL
  Filled 2020-04-10 (×2): qty 1

## 2020-04-10 MED ORDER — CLONAZEPAM 0.5 MG PO TABS
0.5000 mg | ORAL_TABLET | Freq: Every day | ORAL | Status: DC
  Administered 2020-04-10 – 2020-04-12 (×3): 0.5 mg via ORAL
  Filled 2020-04-10 (×3): qty 1

## 2020-04-10 MED ORDER — AMPHETAMINE-DEXTROAMPHETAMINE 5 MG PO TABS: 20.0000 mg | ORAL_TABLET | Freq: Every day | ORAL | Status: DC

## 2020-04-10 MED ORDER — AMPHETAMINE-DEXTROAMPHETAMINE 5 MG PO TABS
10.0000 mg | ORAL_TABLET | Freq: Every day | ORAL | Status: DC
  Administered 2020-04-11 – 2020-04-13 (×3): 10 mg via ORAL
  Filled 2020-04-10 (×3): qty 2

## 2020-04-10 NOTE — Progress Notes (Signed)
Patient was in bed at beginning of shift. Denies SI, but reports depression. Discussed her suicide attempt and stressed the importance of seeing a therapist after discharge. Provided patient with a list of DBT therapists in her area. Patient had covered the window of her door with paper. The MHT told her she needed to remove it. She responded that staff needed to come all the way inside her room anyway to check on her and that they had her permission to enter her room.  I spoke with patient and told her the paper needed to come down. She said the previous shift told her she could keep it up because the light bothered her eyes. I told the patient putting paper on her window was a joint accreditation violation and provided her with washcloths to cover her eyes if needed. At medication time, patient demanded her birth control and her spironolactone that her family had brought in and sent to pharmacy. I checked and there was no order for those medications and I told patient she could have her physician restart them in the morning. She demanded that she get her birth control tonight because she was supposed to start today. I called the NP to ask for orders for the medications that were brought in and she said that the patient's physician could review and restart her medications in the morning. When I relayed this information the patient complained that the NP was not doing her job. Redirected patient to discuss her need for her medications with her physician in the morning. Patient told the MHT that the staff was not doing their job and that she was going to "have a conniption if she did not get her birth control".

## 2020-04-10 NOTE — [facility] Suicide Risk Assessment (Signed)
[facility] INPATIENT:  Family/Significant Other Suicide Prevention Education  Suicide Prevention Education:  Education Completed; Clinical biochemist (Brother),has been identified by the patient as the family member/significant other with whom the patient will be residing, and identified as the person(s) who will aid the patient in the event of a mental health crisis (suicidal ideations/suicide attempt).  With written consent from the patient, the family member/significant other has been provided the following suicide prevention education, prior to the and/or following the discharge of the patient.  The suicide prevention education provided includes the following:  Suicide risk factors  Suicide prevention and interventions  National Suicide Hotline telephone number  [facility] assessment telephone number  [facility] Emergency Assistance 911  [facility] and/or Residential Mobile Crisis Unit telephone number  Request made of family/significant other to:  Remove weapons (e.g., guns, rifles, knives), all items previously/currently identified as safety concern.    Remove drugs/medications (over-the-counter, prescriptions, illicit drugs), all items previously/currently identified as a safety concern.  The family member/significant other verbalizes understanding of the suicide prevention education information provided.  The family member/significant other agrees to remove the items of safety concern listed above.  CSW provided Brianna Peck with the Mobile Crisis Number. Brianna Peck had no other concerns and wanted to know how long his sister would be staying the hospital.   Susa Simmonds, LCSWA 04/10/2020, 11:28 AM
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
## 2020-04-10 NOTE — Progress Notes (Signed)
Pt is alert and oriented to person, place time and situation. Pt is calm, cooperative, pleasant. Pt spends a great deal of time talking on the phone with her family, talked with the doctor, and is out for meals. Pt denies suicidal and homicidal ideation, denies hallucinations, denies feelings of depression and anxiety at this time. Will continue to monitor pt per Q15 minute face checks and monitor for safety and progress.

## 2020-04-10 NOTE — Plan of Care (Signed)
  Problem: Education: Goal: Knowledge of [place] [facility] Education information/materials will improve Outcome: Progressing Goal: Emotional status will improve Outcome: Progressing Goal: Mental status will improve Outcome: Progressing Goal: Verbalization of understanding the information provided will improve Outcome: Progressing   
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
## 2020-04-10 NOTE — [facility] Group Notes (Signed)
[facility] Group Notes: (Clinical Social Work)   04/10/2020      Type of Therapy:  Group Therapy   Participation Level:  Patient was initially meeting with MD during the start of group. Pt did not come to group after.    Shellia Cleverly, [place]  04/10/2020 2:51 PM

## 2020-04-10 NOTE — [facility] Suicide Risk Assessment (Signed)
[facility] Admission Suicide Risk Assessment   Nursing information obtained from:  Patient Demographic factors:  NA Current Mental Status:  Suicidal ideation indicated by patient Loss Factors:  Decline in physical health Historical Factors:  Impulsivity Risk Reduction Factors:  Employed, Living with another person, especially a relative, Positive social support, Positive therapeutic relationship, Positive coping skills or problem solving skills  Total Time spent with patient: 1 hour Principal Problem: Severe recurrent major depression without psychotic features (HCC) Diagnosis:  Principal Problem:   Severe recurrent major depression without psychotic features (HCC) Active Problems:   Suicide attempt by hanging (HCC)   Insomnia   Attention deficit hyperactivity disorder (ADHD)  Subjective Data: Patient came to the hospital after attempted hanging.  Attempt was impulsive but she also has been having multiple symptoms of depression and anxiety with some suicidal ideation recently.  Currently cooperative with treatment with good insight.  Denies any current suicidal wish intent or plan.  Denies any psychotic symptoms.  Cooperative with treatment  Continued Clinical Symptoms:  Alcohol Use Disorder Identification Test Final Score (AUDIT): 0 The "Alcohol Use Disorders Identification Test", Guidelines for Use in Primary Care, Second Edition.  World Science writer [facility]). Score between 0-7:  no or low risk or alcohol related problems. Score between 8-15:  moderate risk of alcohol related problems. Score between 16-19:  high risk of alcohol related problems. Score 20 or above:  warrants further diagnostic evaluation for alcohol dependence and treatment.   CLINICAL FACTORS:   Severe Anxiety and/or Agitation Depression:   Impulsivity   Musculoskeletal: Strength & Muscle Tone: within normal limits Gait & Station: normal Patient leans: N/A  Psychiatric Specialty Exam: Physical Exam Vitals  and nursing note reviewed.  Constitutional:      Appearance: She is well-developed.  HENT:     Head: Normocephalic and atraumatic.  Eyes:     Conjunctiva/sclera: Conjunctivae normal.     Pupils: Pupils are equal, round, and reactive to light.  Cardiovascular:     Heart sounds: Normal heart sounds.  Pulmonary:     Effort: Pulmonary effort is normal.  Abdominal:     Palpations: Abdomen is soft.  Musculoskeletal:        General: Normal range of motion.     Cervical back: Normal range of motion.  Skin:    General: Skin is warm and dry.  Neurological:     General: No focal deficit present.     Mental Status: She is alert.  Psychiatric:        Attention and Perception: Attention normal.        Mood and Affect: Mood is anxious.        Speech: Speech normal.        Behavior: Behavior is cooperative.        Thought Content: Thought content normal.        Cognition and Memory: Cognition normal.        Judgment: Judgment normal.     Review of Systems  Constitutional: Negative.   HENT: Negative.   Eyes: Negative.   Respiratory: Negative.   Cardiovascular: Negative.   Gastrointestinal: Negative.   Musculoskeletal: Negative.   Skin: Negative.   Neurological: Negative.   Psychiatric/Behavioral: Positive for dysphoric mood and sleep disturbance. The patient is nervous/anxious.     Blood pressure 111/73, pulse 71, temperature 97.9 F (36.6 C), temperature source Oral, resp. rate 18, height 5\' 2"  (1.575 m), weight 50.3 kg, last menstrual period 04/09/2020, SpO2 100 %.Body mass index is 20.3  kg/m.  General Appearance: Casual  Eye Contact:  Good  Speech:  Clear and Coherent  Volume:  Normal  Mood:  Euthymic  Affect:  Congruent  Thought Process:  Goal Directed  Orientation:  Full (Time, Place, and Person)  Thought Content:  Logical  Suicidal Thoughts:  No  Homicidal Thoughts:  No  Memory:  Immediate;   Fair Recent;   Fair Remote;   Fair  Judgement:  Good  Insight:  Good   Psychomotor Activity:  Normal  Concentration:  Concentration: Fair  Recall:  [employer] of Knowledge:  Fair  Language:  Fair  Akathisia:  No  Handed:  Right  AIMS (if indicated):     Assets:  Communication Skills Desire for Improvement Financial Resources/Insurance Housing Resilience Social Support Talents/Skills  ADL's:  Intact  Cognition:  WNL  Sleep:  Number of Hours: 7.5      COGNITIVE FEATURES THAT CONTRIBUTE TO RISK:  Thought constriction (tunnel vision)    SUICIDE RISK:   Mild:  Suicidal ideation of limited frequency, intensity, duration, and specificity.  There are no identifiable plans, no associated intent, mild dysphoria and related symptoms, good self-control (both objective and subjective assessment), few other risk factors, and identifiable protective factors, including available and accessible social support.  PLAN OF CARE: Continue 15-minute checks.  Review medication plan.  Discussed possible medicine options in the hospital.  Engage in individual and group therapy.  Reassess daily for dangerousness.  Likely length of stay 1 to 2 days  I certify that inpatient services furnished can reasonably be expected to improve the patient's condition.   Mordecai Rasmussen, MD 04/10/2020, 2:20 PM

## 2020-04-10 NOTE — H&P (Signed)
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  29 year old woman came to the emergency room after an attempted suicide by hanging.  Patient reports that her mood has been bad for several months now.  Frequent depression and anxiety with increasingly frequent panic attacks.  She has been having spells of insomnia sometimes for days at a time and specifically had not slept for about 3 days prior to the suicide attempt.  She and her boyfriend had an argument on the phone earlier in the day which precipitated this but she does admit she had had suicidal thoughts for a few days.  No report of any psychotic symptoms.  Had been compliant with recommended mental health treatment.  Patient texted her boyfriend at the time of the hanging and was found by EMS before much damage could occur.  Patient denies any psychotic symptoms.  Denies alcohol or drug abuse.  Current episode of depression has been going on as noted for several months.  She sees a psychiatric provider in [place] and has been prescribed fluoxetine.  Unclear if any effect has occurred one way or the other with that.  Patient has multiple other medical issues that could be considered in the diagnosis and treatment plan.  She is being worked up by rheumatology for arthritic symptoms and a history of psoriasis.  Takes Humira for that.  Has migraine headaches and has been recently taking Nurtec which is a new medicine for that.   She has a history of multiple head injuries in the past although the most recent one was over 4 years ago.  She had been treated for ADHD by her outpatient psychiatric provider but recently had not been consistently compliant with the Adderall. Associated Signs/Symptoms: Depression Symptoms:  depressed mood, insomnia, suicidal attempt, anxiety, panic attacks, (Hypo) Manic Symptoms:  Impulsivity, Anxiety Symptoms:  Panic Symptoms, Psychotic Symptoms:  None reported PTSD Symptoms: Negative Total Time spent with patient: 1 hour  Past Psychiatric History: No previous hospitalizations.  No previous suicide attempts.  Had been on the fluoxetine for at least several weeks at current dose.  In the past had been treated with Cymbalta after a head injury.  Benefit unclear.  No history of psychotic symptoms.  No history of clear bipolar disorder.  No history of substance abuse.  Is the patient at risk to self? Yes.    Has the patient been a risk to self in the past 6 months? No.  Has the patient been a risk to self within the distant past? No.  Is the patient a risk to others? No.  Has the patient been a risk to others in the past 6 months? No.  Has the patient been a risk to others within the distant past? No.   Prior Inpatient Therapy:   Prior Outpatient Therapy:    Alcohol Screening: 1. How often do you have a drink containing alcohol?: Never 2. How many drinks containing alcohol do you have on a typical day when you are drinking?: 1 or 2 3. How often do you have six or more  drinks on one occasion?: Never AUDIT-C Score: 0 4. How often during the last year have you found that you were not able to stop drinking once you had started?: Never 5. How often during the last year have you failed to do what was normally expected from you because of drinking?: Never 6. How often during the last year have you needed a first drink in the morning to get yourself going after a heavy drinking session?:  Never 7. How often during the last year have you had a feeling of guilt of remorse after drinking?: Never 8. How often during the last year have you been unable to remember what happened the night before because you had been drinking?: Never 9. Have you or someone else been injured as a result of your drinking?: No 10. Has a relative or friend or a doctor or another health worker been concerned about your drinking or suggested you cut down?: No Alcohol Use Disorder Identification Test Final Score (AUDIT): 0 Alcohol Brief Interventions/Follow-up: AUDIT Score <7 follow-up not indicated Substance Abuse History in the last 12 months:  No. Consequences of Substance Abuse: Negative Previous Psychotropic Medications: Yes  Psychological Evaluations: Yes  Past Medical History:  Past Medical History:  Diagnosis Date  . Arthritis   . Hydradenitis   . Thyroid disease     Past Surgical History:  Procedure Laterality Date  . MANDIBLE FRACTURE SURGERY    . SINUS EXPLORATION     Family History: History reviewed. No pertinent family history. Family Psychiatric  History: No known family history Tobacco Screening: Have you used any form of tobacco in the last 30 days? (Cigarettes, Smokeless Tobacco, Cigars, and/or Pipes): No Social History:  Social History   Substance and Sexual Activity  Alcohol Use Yes     Social History   Substance and Sexual Activity  Drug Use Not on file    Additional Social History: Marital status: Long term relationship (Partner broke up with her and this played a role in her suicide attempt. Pt reports he has been supportive since and not sure the status of their relationship.) Long term relationship, how long?: 1 year What types of issues is patient dealing with in the relationship?: Wonderful relationship until patient experienced symptoms of anxiety and depression. Are you sexually active?: Yes What is your sexual orientation?: Straight Has your sexual activity  been affected by drugs, alcohol, medication, or emotional stress?: Emotional stress and medication. Does patient have children?: No                         Allergies:   Allergies  Allergen Reactions  . Cefzil [Cefprozil] Hives   Lab Results:  Results for orders placed or performed during the hospital encounter of 04/09/20 (from the past 48 hour(s))  Comprehensive metabolic panel     Status: Abnormal   Collection Time: 04/09/20  5:16 AM  Result Value Ref Range   Sodium 136 135 - 145 mmol/L   Potassium 3.2 (L) 3.5 - 5.1 mmol/L   Chloride 102 98 - 111 mmol/L   CO2 24 22 - 32 mmol/L   Glucose, Bld 99 70 - 99 mg/dL    Comment: Glucose reference range applies only to samples taken after fasting for at least 8 hours.   BUN 7 6 - 20 mg/dL   Creatinine, Ser 2.50 0.44 - 1.00 mg/dL   Calcium 9.7 8.9 - 53.9 mg/dL   Total Protein 8.4 (H) 6.5 - 8.1  g/dL   Albumin 5.0 3.5 - 5.0 g/dL   AST 23 15 - 41 U/L   ALT 20 0 - 44 U/L   Alkaline Phosphatase 52 38 - 126 U/L   Total Bilirubin 0.7 0.3 - 1.2 mg/dL   GFR calc non Af Amer >60 >60 mL/min   GFR calc Af Amer >60 >60 mL/min   Anion gap 10 5 - 15    Comment: Performed at [facility], [address] [place], [place] [address]  Ethanol     Status: None   Collection Time: 04/09/20  5:16 AM  Result Value Ref Range   Alcohol, Ethyl (B) <10 <10 mg/dL    Comment: (NOTE) Lowest detectable limit for serum alcohol is 10 mg/dL.  For medical purposes only. Performed at [facility], [address] [place], [place] [address]   Salicylate level     Status: Abnormal   Collection Time: 04/09/20  5:16 AM  Result Value Ref Range   Salicylate Lvl <7.0 (L) 7.0 - 30.0 mg/dL    Comment: Performed at [facility], [address] [place], [place] [address]  Acetaminophen level     Status: Abnormal   Collection Time: 04/09/20  5:16 AM  Result Value Ref Range   Acetaminophen (Tylenol), Serum <10 (L) 10 - 30 ug/mL     Comment: (NOTE) Therapeutic concentrations vary significantly. A range of 10-30 ug/mL  may be an effective concentration for many patients. However, some  are best treated at concentrations outside of this range. Acetaminophen concentrations >150 ug/mL at 4 hours after ingestion  and >50 ug/mL at 12 hours after ingestion are often associated with  toxic reactions.  Performed at [facility], [address] [place], [place] [address]   cbc     Status: None   Collection Time: 04/09/20  5:16 AM  Result Value Ref Range   WBC 9.0 4.0 - 10.5 K/uL   RBC 4.54 3.87 - 5.11 MIL/uL   Hemoglobin 14.4 12.0 - 15.0 g/dL   HCT 89.1 36 - 46 %   MCV 89.4 80.0 - 100.0 fL   MCH 31.7 26.0 - 34.0 pg   MCHC 35.5 30.0 - 36.0 g/dL   RDW 69.4 50.3 - 88.8 %   Platelets 337 150 - 400 K/uL   nRBC 0.0 0.0 - 0.2 %    Comment: Performed at [facility], [address], [place], [place] [address]  Urine Drug Screen, Qualitative     Status: None   Collection Time: 04/09/20  5:21 AM  Result Value Ref Range   Tricyclic, Ur Screen NONE DETECTED NONE DETECTED   Amphetamines, Ur Screen NONE DETECTED NONE DETECTED   MDMA (Ecstasy)Ur Screen NONE DETECTED NONE DETECTED   Cocaine Metabolite,Ur [place] NONE DETECTED NONE DETECTED   Opiate, Ur Screen NONE DETECTED NONE DETECTED   Phencyclidine (PCP) Ur S NONE DETECTED NONE DETECTED   Cannabinoid 50 Ng, Ur [place] NONE DETECTED NONE DETECTED   Barbiturates, Ur Screen NONE DETECTED NONE DETECTED   Benzodiazepine, Ur Scrn NONE DETECTED NONE DETECTED   Methadone Scn, Ur NONE DETECTED NONE DETECTED    Comment: (NOTE) Tricyclics + metabolites, urine    Cutoff 1000 ng/mL Amphetamines + metabolites, urine  Cutoff 1000 ng/mL MDMA (Ecstasy), urine              Cutoff 500 ng/mL Cocaine Metabolite, urine          Cutoff 300 ng/mL Opiate + metabolites, urine  Cutoff 300 ng/mL Phencyclidine (PCP), urine         Cutoff 25 ng/mL Cannabinoid, urine                  Cutoff 50 ng/mL Barbiturates + metabolites, urine  Cutoff 200 ng/mL Benzodiazepine, urine              Cutoff 200 ng/mL Methadone, urine                   Cutoff 300 ng/mL  The urine drug screen provides only a preliminary, unconfirmed analytical test result and should not be used for non-medical purposes. Clinical consideration and professional judgment should be applied to any positive drug screen result due to possible interfering substances. A more specific alternate chemical method must be used in order to obtain a confirmed analytical result. Gas chromatography / mass spectrometry (GC/MS) is the preferred confirm atory method. Performed at [facility], [address] [place], [place] [address]   Pregnancy, urine     Status: None   Collection Time: 04/09/20  5:21 AM  Result Value Ref Range   Preg Test, Ur NEGATIVE NEGATIVE    Comment: Performed at [facility], [address] [place], [place] [address]  TSH     Status: None   Collection Time: 04/09/20  5:59 AM  Result Value Ref Range   TSH 1.525 0.350 - 4.500 uIU/mL    Comment: Performed by a 3rd Generation assay with a functional sensitivity of <=0.01 uIU/mL. Performed at [facility], [address] [place], [place] [address]   T4, free     Status: None   Collection Time: 04/09/20  5:59 AM  Result Value Ref Range   Free T4 1.02 0.61 - 1.12 ng/dL    Comment: (NOTE) Biotin ingestion may interfere with free T4 tests. If the results are inconsistent with the TSH level, previous test results, or the clinical presentation, then consider biotin interference. If needed, order repeat testing after stopping biotin. Performed at [facility], [address] [place], [place] [address]   SARS Coronavirus 2 by RT PCR (hospital order, performed in [facility] hospital lab) Nasopharyngeal Nasopharyngeal Swab     Status: None   Collection Time: 04/09/20  6:00 AM   Specimen: Nasopharyngeal  Swab  Result Value Ref Range   SARS Coronavirus 2 NEGATIVE NEGATIVE    Comment: (NOTE) SARS-CoV-2 target nucleic acids are NOT DETECTED.  The SARS-CoV-2 RNA is generally detectable in upper and lower respiratory specimens during the acute phase of infection. The lowest concentration of SARS-CoV-2 viral copies this assay can detect is 250 copies / mL. A negative result does not preclude SARS-CoV-2 infection and should not be used as the sole basis for treatment or other patient management decisions.  A negative result may occur with improper specimen collection / handling, submission of specimen other than nasopharyngeal swab, presence of viral mutation(s) within the areas targeted by this assay, and inadequate number of viral copies (<250 copies / mL). A negative result must be combined with clinical observations, patient history, and epidemiological information.  Fact Sheet for Patients:   BoilerBrush.com.cy  Fact Sheet for Healthcare Providers: https://pope.com/  This test is not yet approved or  cleared by the [place] FDA and has been authorized for detection and/or diagnosis of SARS-CoV-2 by FDA under an Emergency Use Authorization (EUA).  This EUA will remain in effect (meaning this test can be used) for the duration of the  COVID-19 declaration under Section 564(b)(1) of the Act, 21 U.S.C. section 360bbb-3(b)(1), unless the authorization is terminated or revoked sooner.  Performed at [facility], [address] [place], [place] [address]     Blood Alcohol level:  Lab Results  Component Value Date   [facility] <10 04/09/2020    Metabolic Disorder Labs:  No results found for: HGBA1C, MPG No results found for: PROLACTIN No results found for: CHOL, TRIG, HDL, CHOLHDL, VLDL, LDLCALC  Current Medications: Current Facility-Administered Medications  Medication Dose Route Frequency Provider Last Rate Last Admin   . acetaminophen (TYLENOL) tablet 650 mg  650 mg Oral Q6H PRN Dewain Platz T, MD      . alum & mag hydroxide-simeth (MAALOX/MYLANTA) 200-200-20 MG/5ML suspension 30 mL  30 mL Oral Q4H PRN Lourdes Manning, Jackquline Denmark, MD      . Melene Muller ON 04/11/2020] amphetamine-dextroamphetamine (ADDERALL) tablet 10 mg  10 mg Oral Q breakfast Annet Manukyan T, MD      . clonazePAM Scarlette Calico) tablet 0.5 mg  0.5 mg Oral TID PRN Quadarius Henton, Jackquline Denmark, MD   0.5 mg at 04/09/20 2132  . clonazePAM (KLONOPIN) tablet 0.5 mg  0.5 mg Oral QHS Clay Menser T, MD      . FLUoxetine (PROZAC) capsule 20 mg  20 mg Oral QHS Mickey Hebel T, MD      . hydrOXYzine (ATARAX/VISTARIL) tablet 50 mg  50 mg Oral TID PRN Zaidee Rion, Jackquline Denmark, MD   50 mg at 04/09/20 2133  . magnesium hydroxide (MILK OF MAGNESIA) suspension 30 mL  30 mL Oral Daily PRN Habiba Treloar T, MD      . traMADol Janean Sark) tablet 50 mg  50 mg Oral Q6H PRN Miko Markwood, Jackquline Denmark, MD   50 mg at 04/09/20 2228   PTA Medications: Medications Prior to Admission  Medication Sig Dispense Refill Last Dose  . clonazePAM (KLONOPIN) 0.5 MG tablet Take 0.5 mg by mouth daily as needed for anxiety.     Marland Kitchen FLUoxetine (PROZAC) 20 MG capsule Take 20 mg by mouth daily.     Marland Kitchen HUMIRA 40 MG/0.4ML PSKT Inject 40 mg into the skin once a week.     . hydrOXYzine (ATARAX/VISTARIL) 50 MG tablet Take 50 mg by mouth 4 (four) times daily as needed for anxiety or sleep.     . nitrofurantoin, macrocrystal-monohydrate, (MACROBID) 100 MG capsule Take 100 mg by mouth daily.     . NURTEC 75 MG TBDP Take 1 tablet by mouth daily as needed for migraine.     . SRONYX 0.1-20 MG-MCG tablet Take 1 tablet by mouth daily.       Musculoskeletal: Strength & Muscle Tone: within normal limits Gait & Station: normal Patient leans: N/A  Psychiatric Specialty Exam: Physical Exam Vitals and nursing note reviewed.  Constitutional:      Appearance: She is well-developed.  HENT:     Head: Normocephalic and atraumatic.  Eyes:      Conjunctiva/sclera: Conjunctivae normal.     Pupils: Pupils are equal, round, and reactive to light.  Cardiovascular:     Heart sounds: Normal heart sounds.  Pulmonary:     Effort: Pulmonary effort is normal.  Abdominal:     Palpations: Abdomen is soft.  Musculoskeletal:        General: Normal range of motion.     Cervical back: Normal range of motion.  Skin:    General: Skin is warm and dry.  Neurological:     General: No focal deficit present.  Mental Status: She is alert.  Psychiatric:        Attention and Perception: Attention normal.        Mood and Affect: Mood normal.        Speech: Speech normal.        Behavior: Behavior normal. Behavior is cooperative.        Thought Content: Thought content normal.        Cognition and Memory: Cognition normal.        Judgment: Judgment normal.     Review of Systems  Constitutional: Negative.   HENT: Negative.   Eyes: Negative.   Respiratory: Negative.   Cardiovascular: Negative.   Gastrointestinal: Negative.   Musculoskeletal: Negative.   Skin: Negative.   Neurological: Negative.   Psychiatric/Behavioral: Positive for dysphoric mood and sleep disturbance. The patient is nervous/anxious.     Blood pressure 111/73, pulse 71, temperature 97.9 F (36.6 C), temperature source Oral, resp. rate 18, height  (1.575 m), weight 50.3 kg, last menstrual period 04/09/2020, SpO2 100 %.Body mass index is 20.3 kg/m.  General Appearance: Casual  Eye Contact:  Good  Speech:  Clear and Coherent  Volume:  Normal  Mood:  Euthymic  Affect:  Appropriate  Thought Process:  Goal Directed  Orientation:  Full (Time, Place, and Person)  Thought Content:  Logical  Suicidal Thoughts:  No  Homicidal Thoughts:  No  Memory:  Immediate;   Fair Recent;   Fair Remote;   Fair  Judgement:  Fair  Insight:  Fair  Psychomotor Activity:  Normal  Concentration:  Concentration: Fair  Recall:  [employer] of Knowledge:  Fair  Language:  Fair   Akathisia:  No  Handed:  Right  AIMS (if indicated):     Assets:  Desire for Improvement  ADL's:  Intact  Cognition:  WNL  Sleep:  Number of Hours: 7.5    Treatment Plan Summary: Daily contact with patient to assess and evaluate symptoms and progress in treatment, Medication management and Plan Reviewed medication plan with patient.  She does have an outpatient provider and is currently showing improvement in her symptoms and not acute suicidality.  Patient will likely only need a brief stay in the hospital.  Suggest for now we continue the fluoxetine 20 mg in the evening and also use clonazepam at night to help with sleep which had been previously helpful.  Continue the Adderall at modest dose of 10 mg in the morning.  Reassess tomorrow for possible discharge 1 to 2 days.  Observation Level/Precautions:  15 minute checks  Laboratory:  Chemistry Profile  Psychotherapy:    Medications:    Consultations:    Discharge Concerns:    Estimated LOS:  Other:     Physician Treatment Plan for Primary Diagnosis: Severe recurrent major depression without psychotic features (HCC) Long Term Goal(s): Improvement in symptoms so as ready for discharge  Short Term Goals: Ability to verbalize feelings will improve, Ability to disclose and discuss suicidal ideas and Ability to demonstrate self-control will improve  Physician Treatment Plan for Secondary Diagnosis: Principal Problem:   Severe recurrent major depression without psychotic features (HCC) Active Problems:   Suicide attempt by hanging (HCC)   Insomnia   Attention deficit hyperactivity disorder (ADHD)  Long Term Goal(s): Improvement in symptoms so as ready for discharge  Short Term Goals: Ability to maintain clinical measurements within normal limits will improve and Compliance with prescribed medications will improve  I certify that inpatient services furnished can  reasonably be expected to improve the patient's condition.    Mordecai Rasmussen, MD 7/11/20212:24 PM

## 2020-04-11 DIAGNOSIS — F332 Major depressive disorder, recurrent severe without psychotic features: Principal | ICD-10-CM

## 2020-04-11 DIAGNOSIS — L732 Hidradenitis suppurativa: Principal | ICD-10-CM

## 2020-04-11 MED ORDER — FLUTICASONE PROPIONATE 50 MCG/ACT NA SUSP
2.0000 | Freq: Two times a day (BID) | NASAL | Status: DC
  Administered 2020-04-11 – 2020-04-13 (×4): 2 via NASAL
  Filled 2020-04-11: qty 16

## 2020-04-11 MED ORDER — LEVONORGESTREL-ETHINYL ESTRAD 0.1-20 MG-MCG PO TABS
1.0000 | ORAL_TABLET | Freq: Every day | ORAL | Status: DC
  Administered 2020-04-11 – 2020-04-12 (×3): 1 via ORAL
  Filled 2020-04-11 (×3): qty 1

## 2020-04-11 MED ORDER — SPIRONOLACTONE 100 MG PO TABS
100.0000 mg | ORAL_TABLET | Freq: Every day | ORAL | Status: DC
  Administered 2020-04-11 – 2020-04-12 (×2): 100 mg via ORAL
  Filled 2020-04-11 (×3): qty 1

## 2020-04-11 MED ORDER — NON FORMULARY: 2.0000 | Freq: Two times a day (BID) | Status: DC

## 2020-04-11 MED ORDER — NON FORMULARY: 5.0000 mg | Freq: Every day | Status: DC

## 2020-04-11 MED ORDER — SPIRONOLACTONE 100 MG TABLET
ORAL_TABLET | Freq: Every day | ORAL | 3 refills | 90.00000 days | Status: CP
Start: 2020-04-11 — End: 2021-04-11

## 2020-04-11 NOTE — Progress Notes (Signed)
[facility] MD Progress Note  04/11/2020 9:48 AM Brianna Peck  MRN:  [id number] Subjective: Follow-up for this 29 year old woman with depression and recent suicide attempt.  Patient seen chart reviewed.  Patient says she is feeling more down and depressed today.  It sounds like she has probably spoken to her boyfriend and this has brought back some of the immediate stresses that caused her to get into suicidal thinking.  She is still having suicidal thoughts although without any plan or intent.  Patient is irritated today because she was not given her oral birth control pills or spironolactone last night.  Also feels like she slept poorly at night. Principal Problem: Severe recurrent major depression without psychotic features (HCC) Diagnosis: Principal Problem:   Severe recurrent major depression without psychotic features (HCC) Active Problems:   Suicide attempt by hanging (HCC)   Insomnia   Attention deficit hyperactivity disorder (ADHD)  Total Time spent with patient: 30 minutes  Past Psychiatric History: Patient has a history of ADHD, sleep problems, anxiety, with more recent onset of serious depression.  No previous hospitalizations.  Past Medical History:  Past Medical History:  Diagnosis Date   Arthritis    Hydradenitis    Thyroid disease     Past Surgical History:  Procedure Laterality Date   MANDIBLE FRACTURE SURGERY     SINUS EXPLORATION     Family History: History reviewed. No pertinent family history. Family Psychiatric  History: See previous Social History:  Social History   Substance and Sexual Activity  Alcohol Use Yes     Social History   Substance and Sexual Activity  Drug Use Not on file    Social History   Socioeconomic History   Marital status: Single    Spouse name: Not on file   Number of children: Not on file   Years of education: Not on file   Highest education level: Not on file  Occupational History   Not on file  Tobacco Use    Smoking status: Never Smoker   Smokeless tobacco: Never Used  Substance and Sexual Activity   Alcohol use: Yes   Drug use: Not on file   Sexual activity: Not on file  Other Topics Concern   Not on file  Social History Narrative   Not on file   Social Determinants of Health   Financial Resource Strain:    Difficulty of Paying Living Expenses:   Food Insecurity:    Worried About Programme researcher, broadcasting/film/video in the Last Year:    Barista in the Last Year:   Transportation Needs:    Freight forwarder (Medical):    Lack of Transportation (Non-Medical):   Physical Activity:    Days of Exercise per Week:    Minutes of Exercise per Session:   Stress:    Feeling of Stress :   Social Connections:    Frequency of Communication with Friends and Family:    Frequency of Social Gatherings with Friends and Family:    Attends Religious Services:    Active Member of Clubs or Organizations:    Attends Banker Meetings:    Marital Status:    Additional Social History:                         Sleep: Poor  Appetite:  Fair  Current Medications: Current Facility-Administered Medications  Medication Dose Route Frequency Provider Last Rate Last Admin   acetaminophen (TYLENOL) tablet 650  mg  650 mg Oral Q6H PRN Chelesea Weiand T, MD       alum & mag hydroxide-simeth (MAALOX/MYLANTA) 200-200-20 MG/5ML suspension 30 mL  30 mL Oral Q4H PRN Kaylina Cahue T, MD       amphetamine-dextroamphetamine (ADDERALL) tablet 10 mg  10 mg Oral Q breakfast Everlina Gotts, Jackquline Denmark, MD   10 mg at 04/11/20 7681   clonazePAM (KLONOPIN) tablet 0.5 mg  0.5 mg Oral TID PRN Aayushi Solorzano, Jackquline Denmark, MD   0.5 mg at 04/10/20 1733   clonazePAM (KLONOPIN) tablet 0.5 mg  0.5 mg Oral QHS Alexi Geibel T, MD   0.5 mg at 04/10/20 2130   FLUoxetine (PROZAC) capsule 20 mg  20 mg Oral QHS Nichole Neyer T, MD   20 mg at 04/10/20 2130   hydrOXYzine (ATARAX/VISTARIL) tablet 50 mg  50 mg Oral TID PRN  Juneau Doughman, Jackquline Denmark, MD   50 mg at 04/11/20 1572   magnesium hydroxide (MILK OF MAGNESIA) suspension 30 mL  30 mL Oral Daily PRN Jamion Carter, Jackquline Denmark, MD       NON FORMULARY 1 tablet  1 tablet Oral QHS Merlean Pizzini T, MD       spironolactone (ALDACTONE) tablet 100 mg  100 mg Oral QHS Sherree Shankman T, MD       traMADol Janean Sark) tablet 50 mg  50 mg Oral Q6H PRN Jaston Havens, Jackquline Denmark, MD   50 mg at 04/09/20 2228    Lab Results: No results found for this or any previous visit (from the past 48 hour(s)).  Blood Alcohol level:  Lab Results  Component Value Date   ETH <10 04/09/2020    Metabolic Disorder Labs: No results found for: HGBA1C, MPG No results found for: PROLACTIN No results found for: CHOL, TRIG, HDL, CHOLHDL, VLDL, LDLCALC  Physical Findings: AIMS:  , ,  ,  ,    CIWA:    COWS:     Musculoskeletal: Strength & Muscle Tone: within normal limits Gait & Station: normal Patient leans: N/A  Psychiatric Specialty Exam: Physical Exam Vitals and nursing note reviewed.  Constitutional:      Appearance: She is well-developed.  HENT:     Head: Normocephalic and atraumatic.  Eyes:     Conjunctiva/sclera: Conjunctivae normal.     Pupils: Pupils are equal, round, and reactive to light.  Cardiovascular:     Heart sounds: Normal heart sounds.  Pulmonary:     Effort: Pulmonary effort is normal.  Abdominal:     Palpations: Abdomen is soft.  Musculoskeletal:        General: Normal range of motion.     Cervical back: Normal range of motion.  Skin:    General: Skin is warm and dry.  Neurological:     General: No focal deficit present.     Mental Status: She is alert.  Psychiatric:        Attention and Perception: Attention normal.        Mood and Affect: Mood is anxious and depressed.        Speech: Speech normal.        Behavior: Behavior is withdrawn.        Thought Content: Thought content includes suicidal ideation. Thought content does not include suicidal plan.        Cognition  and Memory: Cognition normal.        Judgment: Judgment normal.     Review of Systems  Constitutional: Negative.   HENT: Negative.   Eyes: Negative.  Respiratory: Negative.   Cardiovascular: Negative.   Gastrointestinal: Negative.   Musculoskeletal: Negative.   Skin: Negative.   Neurological: Negative.   Psychiatric/Behavioral: Positive for decreased concentration, dysphoric mood, sleep disturbance and suicidal ideas. Negative for hallucinations.    Blood pressure 109/78, pulse 70, temperature 98.1 F (36.7 C), temperature source Oral, resp. rate 18, height 5\' 2"  (1.575 m), weight 50.3 kg, last menstrual period 04/09/2020, SpO2 100 %.Body mass index is 20.3 kg/m.  General Appearance: Casual  Eye Contact:  Minimal  Speech:  Slow  Volume:  Decreased  Mood:  Depressed  Affect:  Constricted  Thought Process:  Coherent  Orientation:  Full (Time, Place, and Person)  Thought Content:  Logical  Suicidal Thoughts:  Yes.  without intent/plan  Homicidal Thoughts:  No  Memory:  Immediate;   Fair Recent;   Fair Remote;   Fair  Judgement:  Fair  Insight:  Fair  Psychomotor Activity:  Decreased  Concentration:  Concentration: Fair  Recall:  06/10/2020 of Knowledge:  Fair  Language:  Fair  Akathisia:  No  Handed:  Right  AIMS (if indicated):     Assets:  Desire for Improvement Resilience Social Support  ADL's:  Intact  Cognition:  WNL  Sleep:  Number of Hours: 7.5     Treatment Plan Summary: Daily contact with patient to assess and evaluate symptoms and progress in treatment, Medication management and Plan Patient continues to feel depressed and is more tearful today irritable down and negative.  Still with some suicidal thoughts without specific plan.  I will try to get her her birth control pill as soon as possible and put in the order for the spironolactone.  Not yet at a point of making any changes in medicine.  Encourage group attendance today and follow-up to see if she  will be ready for discharge in another day or will need further intervention.  [employer], MD 04/11/2020, 9:48 AM

## 2020-04-11 NOTE — [facility] Group Notes (Signed)
[facility] Group Notes:  (Nursing/MHT/Case Management/Adjunct)  Date:  04/11/2020  Time:  9:42 PM  Type of Therapy:  Group Therapy  Participation Level:  Did Not Attend   Mayra Neer 04/11/2020, 9:42 PM

## 2020-04-11 NOTE — Plan of Care (Signed)
Patient was very sad and tearful this morning about her break up with boyfriend.Patient stated that boyfriend is not letting her visit the dogs and her garden.Patient verbalized passive suicidal thoughts with no plan.Patient stated that she feels little better this afternoon.Appropriate with staff & peers.Visible in the milieu.Patient stated that she is OK with the Flonase nasal spray for her allergy.Compliant with medications.Attended groups.Appetite and energy level good.Support and encouragement given.

## 2020-04-11 NOTE — Progress Notes (Signed)
Recreation Therapy Notes          Gustave Lindeman 04/11/2020 12:06 PM

## 2020-04-11 NOTE — [facility] Group Notes (Signed)
LCSW Group Therapy Note   04/11/2020 2:53 PM  Type of Therapy and Topic:  Group Therapy:  Overcoming Obstacles   Participation Level:  Active   Description of Group:    In this group patients will be encouraged to explore what they see as obstacles to their own wellness and recovery. They will be guided to discuss their thoughts, feelings, and behaviors related to these obstacles. The group will process together ways to cope with barriers, with attention given to specific choices patients can make. Each patient will be challenged to identify changes they are motivated to make in order to overcome their obstacles. This group will be process-oriented, with patients participating in exploration of their own experiences as well as giving and receiving support and challenge from other group members.   Therapeutic Goals: 1. Patient will identify personal and current obstacles as they relate to admission. 2. Patient will identify barriers that currently interfere with their wellness or overcoming obstacles.  3. Patient will identify feelings, thought process and behaviors related to these barriers. 4. Patient will identify two changes they are willing to make to overcome these obstacles:      Summary of Patient Progress Patient was present in group.  Patient shared how she is dealt with "chronic insomnia and relationship trouble".  She was engaged and active in group discussions.     Therapeutic Modalities:   Cognitive Behavioral Therapy Solution Focused Therapy Motivational Interviewing Relapse Prevention Therapy  Penni Homans, MSW, LCSW 04/11/2020 2:53 PM

## 2020-04-12 LAB — T3, FREE: T3, Free: 4.5 pg/mL — ABNORMAL HIGH (ref 2.0–4.4)

## 2020-04-12 MED ORDER — ONDANSETRON 4 MG PO TBDP
8.0000 mg | ORAL_TABLET | Freq: Three times a day (TID) | ORAL | Status: DC | PRN
  Filled 2020-04-12: qty 2

## 2020-04-12 MED ORDER — FLUOXETINE HCL 20 MG PO CAPS
40.0000 mg | ORAL_CAPSULE | Freq: Every day | ORAL | Status: DC
  Administered 2020-04-12: 40 mg via ORAL
  Filled 2020-04-12: qty 2

## 2020-04-12 NOTE — Plan of Care (Signed)
Pt rates depression 7/10, 6/10 hopelessness and anxiety 7/10, Pt was educated on care plan and verbalizes understanding. Pt was encouraged to attend groups. Torrie Mayers RN Problem: Education: Goal: Knowledge of [place] [facility] Education information/materials will improve Outcome: Progressing Goal: Emotional status will improve Outcome: Progressing Goal: Mental status will improve Outcome: Progressing Goal: Verbalization of understanding the information provided will improve Outcome: Progressing

## 2020-04-12 NOTE — Plan of Care (Signed)
  Problem: Education: Goal: Knowledge of [place] [facility] Education information/materials will improve Outcome: Progressing Goal: Emotional status will improve Outcome: Progressing Goal: Mental status will improve Outcome: Progressing Goal: Verbalization of understanding the information provided will improve Outcome: Progressing   

## 2020-04-12 NOTE — Progress Notes (Addendum)
Recreation Therapy Notes   Date: 04/12/2020  Time: 9:30 am  Location: Craft-room    Behavioral response: Appropriate  Intervention Topic: Self- esteem   Discussion/Intervention:  Group content today was focused on self-esteem. Patient defined self-esteem and where it comes form. The group described reasons self-esteem is important. Individuals stated things that impact self-esteem and positive ways to improve self-esteem. The group participated in the intervention "Collage of Me" where patients were able to create a collage of positive things that makes them who they are.  Clinical Observations/Feedback:  Patient came to group and defined self-esteem as the value we place on ourself weather it is good or bad. She identified positive affirmations and mood swing as components of self-esteem. Participant explained that keeping a gratitude journal helps her improve on her self-esteem. Individual was social with peers and staff while participating in the intervention.     Theador Jezewski LRT/CTRS         Revecca Nachtigal 04/12/2020 1:06 PM

## 2020-04-12 NOTE — Progress Notes (Signed)
Patient was on the phone continuously at beginning of shift. Has complained of staff making too much noise while doing checks, then complained they were not walking far enough into her room. Complained of being hot and asked for a fan. Complained of nausea and asked specifically for Zofran, then complained of pain and asked for tramadol. Finally patient is complaining of not being able to sleep and is asking to sit in the hall in front of nurses station. Received prn Klonopin an hour after hs medications for anxiety. Denies SI. Says her boyfriend does not think it is a good idea for her to visit him right now. She continues to ruminate about missing his dogs and the garden he let her plant on his property. Offered support.

## 2020-04-12 NOTE — Tx Team (Addendum)
Interdisciplinary Treatment and Diagnostic Plan Update  04/12/2020 Time of Session: 9:00AM Brianna Peck MRN: [id number]  Principal Diagnosis: Severe recurrent major depression without psychotic features [facility])  Secondary Diagnoses: Principal Problem:   Severe recurrent major depression without psychotic features (HCC) Active Problems:   Suicide attempt by hanging (HCC)   Insomnia   Attention deficit hyperactivity disorder (ADHD)   Current Medications:  Current Facility-Administered Medications  Medication Dose Route Frequency Provider Last Rate Last Admin  . acetaminophen (TYLENOL) tablet 650 mg  650 mg Oral Q6H PRN Clapacs, John T, MD      . alum & mag hydroxide-simeth (MAALOX/MYLANTA) 200-200-20 MG/5ML suspension 30 mL  30 mL Oral Q4H PRN Clapacs, John T, MD      . amphetamine-dextroamphetamine (ADDERALL) tablet 10 mg  10 mg Oral Q breakfast Clapacs, Jackquline Denmark, MD   10 mg at 04/12/20 0842  . clonazePAM (KLONOPIN) tablet 0.5 mg  0.5 mg Oral TID PRN Clapacs, Jackquline Denmark, MD   0.5 mg at 04/12/20 0842  . clonazePAM (KLONOPIN) tablet 0.5 mg  0.5 mg Oral QHS Clapacs, John T, MD   0.5 mg at 04/11/20 2212  . FLUoxetine (PROZAC) capsule 40 mg  40 mg Oral QHS Clapacs, John T, MD      . fluticasone (FLONASE) 50 MCG/ACT nasal spray 2 spray  2 spray Each Nare BID Clapacs, Jackquline Denmark, MD   2 spray at 04/12/20 0843  . hydrOXYzine (ATARAX/VISTARIL) tablet 50 mg  50 mg Oral TID PRN Clapacs, Jackquline Denmark, MD   50 mg at 04/11/20 1741  . levonorgestrel-ethinyl estradiol (ALESSE) 0.1-20 MG-MCG per tablet 1 tablet  1 tablet Oral QHS Clapacs, Jackquline Denmark, MD   1 tablet at 04/11/20 2211  . magnesium hydroxide (MILK OF MAGNESIA) suspension 30 mL  30 mL Oral Daily PRN Clapacs, John T, MD      . NON FORMULARY 2 spray  2 spray Nasal BID Clapacs, John T, MD      . NON FORMULARY 5 mg  5 mg Oral Daily Clapacs, John T, MD      . ondansetron (ZOFRAN-ODT) disintegrating tablet 8 mg  8 mg Oral Q8H PRN Gillermo Murdoch, NP      .  spironolactone (ALDACTONE) tablet 100 mg  100 mg Oral QHS Clapacs, John T, MD   100 mg at 04/11/20 2212  . traMADol (ULTRAM) tablet 50 mg  50 mg Oral Q6H PRN Clapacs, Jackquline Denmark, MD   50 mg at 04/11/20 1741   PTA Medications: Medications Prior to Admission  Medication Sig Dispense Refill Last Dose  . clonazePAM (KLONOPIN) 0.5 MG tablet Take 0.5 mg by mouth daily as needed for anxiety.     Marland Kitchen FLUoxetine (PROZAC) 20 MG capsule Take 20 mg by mouth daily.     Marland Kitchen HUMIRA 40 MG/0.4ML PSKT Inject 40 mg into the skin once a week.     . hydrOXYzine (ATARAX/VISTARIL) 50 MG tablet Take 50 mg by mouth 4 (four) times daily as needed for anxiety or sleep.     . nitrofurantoin, macrocrystal-monohydrate, (MACROBID) 100 MG capsule Take 100 mg by mouth daily.     . NURTEC 75 MG TBDP Take 1 tablet by mouth daily as needed for migraine.     . SRONYX 0.1-20 MG-MCG tablet Take 1 tablet by mouth daily.       Patient Stressors: Health problems Marital or family conflict  Patient Strengths: Ability for insight Active sense of humor  Treatment Modalities: Medication Management, Group therapy, Case  management,  1 to 1 session with clinician, Psychoeducation, Recreational therapy.   Physician Treatment Plan for Primary Diagnosis: Severe recurrent major depression without psychotic features (HCC) Long Term Goal(s): Improvement in symptoms so as ready for discharge Improvement in symptoms so as ready for discharge   Short Term Goals: Ability to verbalize feelings will improve Ability to disclose and discuss suicidal ideas Ability to demonstrate self-control will improve Ability to maintain clinical measurements within normal limits will improve Compliance with prescribed medications will improve  Medication Management: Evaluate patient's response, side effects, and tolerance of medication regimen.  Therapeutic Interventions: 1 to 1 sessions, Unit Group sessions and Medication administration.  Evaluation of  Outcomes: Progressing  Physician Treatment Plan for Secondary Diagnosis: Principal Problem:   Severe recurrent major depression without psychotic features (HCC) Active Problems:   Suicide attempt by hanging (HCC)   Insomnia   Attention deficit hyperactivity disorder (ADHD)  Long Term Goal(s): Improvement in symptoms so as ready for discharge Improvement in symptoms so as ready for discharge   Short Term Goals: Ability to verbalize feelings will improve Ability to disclose and discuss suicidal ideas Ability to demonstrate self-control will improve Ability to maintain clinical measurements within normal limits will improve Compliance with prescribed medications will improve     Medication Management: Evaluate patient's response, side effects, and tolerance of medication regimen.  Therapeutic Interventions: 1 to 1 sessions, Unit Group sessions and Medication administration.  Evaluation of Outcomes: Progressing   RN Treatment Plan for Primary Diagnosis: Severe recurrent major depression without psychotic features (HCC) Long Term Goal(s): Knowledge of disease and therapeutic regimen to maintain health will improve  Short Term Goals: Ability to demonstrate self-control, Ability to participate in decision making will improve, Ability to verbalize feelings will improve, Ability to identify and develop effective coping behaviors will improve and Compliance with prescribed medications will improve  Medication Management: RN will administer medications as ordered by provider, will assess and evaluate patient's response and provide education to patient for prescribed medication. RN will report any adverse and/or side effects to prescribing provider.  Therapeutic Interventions: 1 on 1 counseling sessions, Psychoeducation, Medication administration, Evaluate responses to treatment, Monitor vital signs and CBGs as ordered, Perform/monitor CIWA, COWS, AIMS and Fall Risk screenings as ordered, Perform  wound care treatments as ordered.  Evaluation of Outcomes: Progressing   LCSW Treatment Plan for Primary Diagnosis: Severe recurrent major depression without psychotic features (HCC) Long Term Goal(s): Safe transition to appropriate next level of care at discharge, Engage patient in therapeutic group addressing interpersonal concerns.  Short Term Goals: Engage patient in aftercare planning with referrals and resources, Increase social support, Increase ability to appropriately verbalize feelings, Increase emotional regulation, Facilitate acceptance of mental health diagnosis and concerns and Increase skills for wellness and recovery  Therapeutic Interventions: Assess for all discharge needs, 1 to 1 time with Social worker, Explore available resources and support systems, Assess for adequacy in community support network, Educate family and significant other(s) on suicide prevention, Complete Psychosocial Assessment, Interpersonal group therapy.  Evaluation of Outcomes: Progressing   Progress in Treatment: Attending groups: Yes. Participating in groups: Yes. Taking medication as prescribed: Yes. Toleration medication: Yes. Family/Significant other contact made: Yes, individual(s) contacted:  SPE completed with the patient's brother. Patient understands diagnosis: Yes. Discussing patient identified problems/goals with staff: Yes. Medical problems stabilized or resolved: Yes. Denies suicidal/homicidal ideation: Yes. Issues/concerns per patient self-inventory: No. Other: none  New problem(s) identified: No, Describe:  none  New Short Term/Long Term Goal(s): medication  management for mood stabilization; elimination of SI thoughts; development of comprehensive mental wellness plan.  Patient Goals:  "help with coping skills"  Discharge Plan or Barriers: Patient reports plans to return home and is seeking a provider in [facility].   Reason for Continuation of Hospitalization:  Anxiety Depression Medication stabilization Suicidal ideation  Estimated Length of Stay: 1-7 days  Recreational Therapy: Patient: N/A Patient Goal: Patient will engage in groups without prompting or encouragement from LRT x3 group sessions within 5 recreation therapy group sessions  Attendees: Patient: Brianna Peck 04/12/2020 1:02 PM  Physician: Dr. Toni Amend, MD 04/12/2020 1:02 PM  Nursing: Torrie Mayers, RN 04/12/2020 1:02 PM  RN Care Manager: 04/12/2020 1:02 PM  Social Worker: Penni Homans, LCSW 04/12/2020 1:02 PM  Recreational Therapist: Garret Reddish, CTRS, LRT 04/12/2020 1:02 PM  Other: Lowella Dandy, LCSW 04/12/2020 1:02 PM  Other:  04/12/2020 1:02 PM  Other: 04/12/2020 1:02 PM    Scribe for Treatment Team: Harden Mo, LCSW 04/12/2020 1:02 PM

## 2020-04-12 NOTE — Progress Notes (Signed)
[facility] MD Progress Note  04/12/2020 5:48 PM Amand Peck  MRN:  [id number] Subjective: Patient seen today.  She was more detailed about the major life stresses she has had.  Remains sad tearful and down with passive suicidal thoughts.  Still feels like she is not sleeping well.  Angry and irritable much of the time. Principal Problem: Severe recurrent major depression without psychotic features (HCC) Diagnosis: Principal Problem:   Severe recurrent major depression without psychotic features (HCC) Active Problems:   Suicide attempt by hanging (HCC)   Insomnia   Attention deficit hyperactivity disorder (ADHD)  Total Time spent with patient: 30 minutes  Past Psychiatric History: No known prior hospitalization.  Recent suicidal behavior.  Past Medical History:  Past Medical History:  Diagnosis Date  . Arthritis   . Hydradenitis   . Thyroid disease     Past Surgical History:  Procedure Laterality Date  . MANDIBLE FRACTURE SURGERY    . SINUS EXPLORATION     Family History: History reviewed. No pertinent family history. Family Psychiatric  History: See previous note Social History:  Social History   Substance and Sexual Activity  Alcohol Use Yes     Social History   Substance and Sexual Activity  Drug Use Not on file    Social History   Socioeconomic History  . Marital status: Single    Spouse name: Not on file  . Number of children: Not on file  . Years of education: Not on file  . Highest education level: Not on file  Occupational History  . Not on file  Tobacco Use  . Smoking status: Never Smoker  . Smokeless tobacco: Never Used  Substance and Sexual Activity  . Alcohol use: Yes  . Drug use: Not on file  . Sexual activity: Not on file  Other Topics Concern  . Not on file  Social History Narrative  . Not on file   Social Determinants of Health   Financial Resource Strain:   . Difficulty of Paying Living Expenses:   Food Insecurity:   . Worried About  Programme researcher, broadcasting/film/video in the Last Year:   . Barista in the Last Year:   Transportation Needs:   . Freight forwarder (Medical):   Marland Kitchen Lack of Transportation (Non-Medical):   Physical Activity:   . Days of Exercise per Week:   . Minutes of Exercise per Session:   Stress:   . Feeling of Stress :   Social Connections:   . Frequency of Communication with Friends and Family:   . Frequency of Social Gatherings with Friends and Family:   . Attends Religious Services:   . Active Member of Clubs or Organizations:   . Attends Banker Meetings:   Marland Kitchen Marital Status:    Additional Social History:                         Sleep: Fair  Appetite:  Fair  Current Medications: Current Facility-Administered Medications  Medication Dose Route Frequency Provider Last Rate Last Admin  . acetaminophen (TYLENOL) tablet 650 mg  650 mg Oral Q6H PRN Terika Pillard T, MD      . alum & mag hydroxide-simeth (MAALOX/MYLANTA) 200-200-20 MG/5ML suspension 30 mL  30 mL Oral Q4H PRN Livier Hendel T, MD      . amphetamine-dextroamphetamine (ADDERALL) tablet 10 mg  10 mg Oral Q breakfast Aysia Lowder, Jackquline Denmark, MD   10 mg at 04/12/20 0842  .  clonazePAM (KLONOPIN) tablet 0.5 mg  0.5 mg Oral TID PRN Franko Hilliker, Jackquline Denmark, MD   0.5 mg at 04/12/20 1702  . clonazePAM (KLONOPIN) tablet 0.5 mg  0.5 mg Oral QHS Zeev Deakins T, MD   0.5 mg at 04/11/20 2212  . FLUoxetine (PROZAC) capsule 40 mg  40 mg Oral QHS Hermine Feria T, MD      . fluticasone (FLONASE) 50 MCG/ACT nasal spray 2 spray  2 spray Each Nare BID Zakariah Urwin, Jackquline Denmark, MD   2 spray at 04/12/20 1703  . hydrOXYzine (ATARAX/VISTARIL) tablet 50 mg  50 mg Oral TID PRN Calandra Madura, Jackquline Denmark, MD   50 mg at 04/12/20 1322  . levonorgestrel-ethinyl estradiol (ALESSE) 0.1-20 MG-MCG per tablet 1 tablet  1 tablet Oral QHS Cheryal Salas, Jackquline Denmark, MD   1 tablet at 04/11/20 2211  . magnesium hydroxide (MILK OF MAGNESIA) suspension 30 mL  30 mL Oral Daily PRN Giovannie Scerbo, Jackquline Denmark, MD    30 mL at 04/12/20 1536  . NON FORMULARY 2 spray  2 spray Nasal BID Idania Desouza T, MD      . NON FORMULARY 5 mg  5 mg Oral Daily Dandy Lazaro T, MD      . ondansetron (ZOFRAN-ODT) disintegrating tablet 8 mg  8 mg Oral Q8H PRN Gillermo Murdoch, NP      . spironolactone (ALDACTONE) tablet 100 mg  100 mg Oral QHS Stacee Earp T, MD   100 mg at 04/11/20 2212  . traMADol (ULTRAM) tablet 50 mg  50 mg Oral Q6H PRN Teiara Baria, Jackquline Denmark, MD   50 mg at 04/11/20 1741    Lab Results: No results found for this or any previous visit (from the past 48 hour(s)).  Blood Alcohol level:  Lab Results  Component Value Date   ETH <10 04/09/2020    Metabolic Disorder Labs: No results found for: HGBA1C, MPG No results found for: PROLACTIN No results found for: CHOL, TRIG, HDL, CHOLHDL, VLDL, LDLCALC  Physical Findings: AIMS:  , ,  ,  ,    CIWA:    COWS:     Musculoskeletal: Strength & Muscle Tone: within normal limits Gait & Station: normal Patient leans: N/A  Psychiatric Specialty Exam: Physical Exam Vitals and nursing note reviewed.  Constitutional:      Appearance: She is well-developed.  HENT:     Head: Normocephalic and atraumatic.  Eyes:     Conjunctiva/sclera: Conjunctivae normal.     Pupils: Pupils are equal, round, and reactive to light.  Cardiovascular:     Heart sounds: Normal heart sounds.  Pulmonary:     Effort: Pulmonary effort is normal.  Abdominal:     Palpations: Abdomen is soft.  Musculoskeletal:        General: Normal range of motion.     Cervical back: Normal range of motion.  Skin:    General: Skin is warm and dry.  Neurological:     General: No focal deficit present.     Mental Status: She is alert.  Psychiatric:        Attention and Perception: Attention normal.        Mood and Affect: Mood is anxious and depressed. Affect is tearful.        Speech: Speech normal.        Behavior: Behavior is agitated. Behavior is not aggressive.        Thought Content:  Thought content is not paranoid. Thought content includes suicidal ideation. Thought content does not include  homicidal ideation. Thought content does not include suicidal plan.        Cognition and Memory: Cognition normal.        Judgment: Judgment normal.     Review of Systems  Constitutional: Negative.   HENT: Negative.   Eyes: Negative.   Respiratory: Negative.   Cardiovascular: Negative.   Gastrointestinal: Negative.   Musculoskeletal: Negative.   Skin: Negative.   Neurological: Negative.   Psychiatric/Behavioral: Positive for dysphoric mood and suicidal ideas.    Blood pressure 114/70, pulse 68, temperature 97.8 F (36.6 C), temperature source Oral, resp. rate 18, height 5\' 2"  (1.575 m), weight 50.3 kg, last menstrual period 04/09/2020, SpO2 100 %.Body mass index is 20.3 kg/m.  General Appearance: Casual  Eye Contact:  Good  Speech:  Normal Rate  Volume:  Normal  Mood:  Depressed and Dysphoric  Affect:  Depressed and Tearful  Thought Process:  Coherent  Orientation:  Full (Time, Place, and Person)  Thought Content:  Logical  Suicidal Thoughts:  Yes.  without intent/plan  Homicidal Thoughts:  No  Memory:  Immediate;   Fair Recent;   Fair Remote;   Fair  Judgement:  Fair  Insight:  Fair  Psychomotor Activity:  Normal  Concentration:  Concentration: Fair  Recall:  06/10/2020 of Knowledge:  Fair  Language:  Fair  Akathisia:  No  Handed:  Right  AIMS (if indicated):     Assets:  Communication Skills Desire for Improvement Financial Resources/Insurance Housing Physical Health Resilience Social Support  ADL's:  Intact  Cognition:  WNL  Sleep:  Number of Hours: 5.25     Treatment Plan Summary: Daily contact with patient to assess and evaluate symptoms and progress in treatment, Medication management and Plan We reviewed medication plan and decided to increase Prozac to 40 mg a day.  Side effects reviewed.  Continue medication otherwise.  Encourage group  attendance.  Offered lots of support and validation to the patient and her frustrations on the unit.  We will review tomorrow and consider possibly discharge within the next day or 2  [employer], MD 04/12/2020, 5:48 PM

## 2020-04-12 NOTE — Progress Notes (Signed)
D- Patient alert and oriented. Affect/mood is sad and anxious. Pt denies SI, HI, AVH, and pain. Pt has been cooperative, med compliant.   A- Scheduled medications administered to patient, per MD orders. Support and encouragement provided.  Routine safety checks conducted every 15 minutes.  Patient informed to notify staff with problems or concerns.  R- No adverse drug reactions noted. Patient contracts for safety at this time. Patient compliant with medications and treatment plan. Patient receptive, calm, and cooperative. Patient interacts well with others on the unit.  Patient remains safe at this time.  Torrie Mayers RN

## 2020-04-12 NOTE — [facility] Group Notes (Signed)
Feelings Around Relapse 04/12/2020 9:30AM/1PM  Type of Therapy and Topic:  Group Therapy:  Feelings around Relapse and Recovery  Participation Level:  Active   Description of Group:    Patients in this group will discuss emotions they experience before and after a relapse. They will process how experiencing these feelings, or avoidance of experiencing them, relates to having a relapse. Facilitator will guide patients to explore emotions they have related to recovery. Patients will be encouraged to process which emotions are more powerful. They will be guided to discuss the emotional reaction significant others in their lives may have to patients' relapse or recovery. Patients will be assisted in exploring ways to respond to the emotions of others without this contributing to a relapse.  Therapeutic Goals: 1. Patient will identify two or more emotions that lead to a relapse for them 2. Patient will identify two emotions that result when they relapse 3. Patient will identify two emotions related to recovery 4. Patient will demonstrate ability to communicate their needs through discussion and/or role plays   Summary of Patient Progress: Actively and appropriately engaged in the group. Patient was able to provide support and validation to other group members.Patient practiced active listening when interacting with the facilitator and other group members. Patient identified an older brother as a part of her support system and assisting her during her recovery. Patient demonstrated good understanding of the subject matter and was able to discuss with group members healthy coping methods.     Therapeutic Modalities:   Cognitive Behavioral Therapy Solution-Focused Therapy Assertiveness Training Relapse Prevention Therapy   Suzan Slick, LCSW 04/12/2020 2:47 PM

## 2020-04-13 MED ORDER — AMPHETAMINE-DEXTROAMPHETAMINE 5 MG PO TABS: 10.0000 mg | ORAL_TABLET | Freq: Every day | ORAL | 0 refills | Status: AC

## 2020-04-13 MED ORDER — FLUOXETINE HCL 40 MG PO CAPS: 40.0000 mg | ORAL_CAPSULE | Freq: Every day | ORAL | 1 refills | Status: AC

## 2020-04-13 MED ORDER — SPIRONOLACTONE 100 MG PO TABS: 100.0000 mg | ORAL_TABLET | Freq: Every day | ORAL | 1 refills | Status: AC

## 2020-04-13 MED ORDER — CLONAZEPAM 0.5 MG PO TABS: 0.5000 mg | ORAL_TABLET | Freq: Every day | ORAL | 1 refills | Status: AC

## 2020-04-13 MED ORDER — FLUTICASONE PROPIONATE 50 MCG/ACT NA SUSP: 2.0000 | Freq: Two times a day (BID) | NASAL | 2 refills | Status: AC

## 2020-04-13 MED ORDER — HYDROXYZINE HCL 50 MG PO TABS: 50.0000 mg | ORAL_TABLET | Freq: Three times a day (TID) | ORAL | 1 refills | Status: AC | PRN

## 2020-04-13 NOTE — Progress Notes (Signed)
Patient alert and oriented x 4, thoughts are organized and coherent, she appears less anxious she denies SI/HI/AVH interacting appropriately with peers and staff no distress noted, she was complaint with medication regimen and attended evening wrap up group, 15 minutes safety checks maintained will continue to monitor

## 2020-04-13 NOTE — Plan of Care (Signed)
  Pr Problem: Education: Goal: Knowledge of [facility] Education information/materials will improve 04/13/2020 1232 by Chalmers Cater, RN Outcome: Adequate for Discharge 04/13/2020 1019 by Chalmers Cater, RN Outcome: Adequate for Discharge Goal: Emotional status will improve 04/13/2020 1232 by Chalmers Cater, RN Outcome: Adequate for Discharge 04/13/2020 1019 by Chalmers Cater, RN Outcome: Adequate for Discharge Goal: Mental status will improve 04/13/2020 1232 by Chalmers Cater, RN Outcome: Adequate for Discharge 04/13/2020 1019 by Chalmers Cater, RN Outcome: Adequate for Discharge Goal: Verbalization of understanding the information provided will improve 04/13/2020 1232 by Chalmers Cater, RN Outcome: Adequate for Discharge 04/13/2020 1019 by Chalmers Cater, RN Outcome: Adequate for Discharge

## 2020-04-13 NOTE — Progress Notes (Signed)
Pt received dc packet, prescriptions, belongings and  meds from home. Pt denies SI, HI and AVH.Pt was educated on [employer] plan and verbalizes understanding  Brianna Peck

## 2020-04-13 NOTE — Progress Notes (Signed)
Recreation Therapy Notes  Date: 04/13/2020  Time: 9:30 am  Location: Craft-room    Behavioral response: Appropriate  Intervention Topic: Self-care  Discussion/Intervention:  Group content today was focused on Self-Care. The group defined self-care and some positive ways they care for themselves. Individuals expressed ways and reasons why they neglected any self-care in the past. Patients described ways to improve self-care in the future. The group explained what could happen if they did not do any self-care activities at all. The group participated in the intervention "self-care assessment" where they had a chance to discover some of their weaknesses and strengths in self- care. Patient came up with a self-care plan to improve themselves in the future.  Clinical Observations/Feedback:  Patient came to group and defined self-care as taking care of yourself. She stated that you must take care of yourself before you can take care of anyone else. Individual was social with peers and staff while participating in the intervention.     Brianna Peck LRT/CTRS         Brianna Peck 04/13/2020 12:23 PM

## 2020-04-13 NOTE — B[facility] Suicide Risk Assessment (Signed)
[facility] Discharge Suicide Risk Assessment   Principal Problem: Severe recurrent major depression without psychotic features [facility]) Discharge Diagnoses: Principal Problem:   Severe recurrent major depression without psychotic features (HCC) Active Problems:   Suicide attempt by hanging (HCC)   Insomnia   Attention deficit hyperactivity disorder (ADHD)   Total Time spent with patient: 30 minutes  Musculoskeletal: Strength & Muscle Tone: within normal limits Gait & Station: normal Patient leans: Right  Psychiatric Specialty Exam: Review of Systems  Constitutional: Negative.   HENT: Negative.   Eyes: Negative.   Respiratory: Negative.   Cardiovascular: Negative.   Gastrointestinal: Negative.   Musculoskeletal: Negative.   Skin: Negative.   Neurological: Negative.   Psychiatric/Behavioral: Negative.     Blood pressure 118/74, pulse 73, temperature 97.8 F (36.6 C), temperature source Oral, resp. rate 17, height 5\' 2"  (1.575 m), weight 50.3 kg, last menstrual period 04/09/2020, SpO2 100 %.Body mass index is 20.3 kg/m.  General Appearance: Fairly Groomed  06/10/2020::  Good  Speech:  Clear and Coherent409  Volume:  Normal  Mood:  Euthymic  Affect:  Congruent  Thought Process:  Goal Directed  Orientation:  Full (Time, Place, and Person)  Thought Content:  Logical  Suicidal Thoughts:  No  Homicidal Thoughts:  No  Memory:  Immediate;   Fair Recent;   Fair Remote;   Fair  Judgement:  Fair  Insight:  Fair  Psychomotor Activity:  Normal  Concentration:  Fair  Recall:  [phone] of Knowledge:Fair  Language: Fair  Akathisia:  No  Handed:  Right  AIMS (if indicated):     Assets:  Communication Skills Desire for Improvement Financial Resources/Insurance Housing Physical Health Resilience Social Support  Sleep:  Number of Hours: 5.25  Cognition: WNL  ADL's:  Intact   Mental Status Per Nursing Assessment::   On Admission:  Suicidal ideation indicated by  patient  Demographic Factors:  Living alone  Loss Factors: Loss of significant relationship  Historical Factors: Impulsivity  Risk Reduction Factors:   Sense of responsibility to family, Employed, Living with another person, especially a relative, Positive social support, Positive therapeutic relationship and Positive coping skills or problem solving skills  Continued Clinical Symptoms:  Depression:   Impulsivity  Cognitive Features That Contribute To Risk:  None    Suicide Risk:  Minimal: No identifiable suicidal ideation.  Patients presenting with no risk factors but with morbid ruminations; may be classified as minimal risk based on the severity of the depressive symptoms    Plan Of Care/Follow-up recommendations:  Activity:  Activity as tolerated Diet:  Regular diet Other:  Follow-up with outpatient mental health treatment following recommendations given continue current medicine.  [phone], MD 04/13/2020, 9:21 AM

## 2020-04-13 NOTE — B[facility] Group Notes (Signed)
[facility] Group Notes:  (Nursing/MHT/Case Management/Adjunct)  Date:  04/13/2020  Time:  10:27 AM  Type of Therapy:  COMMUNITY MEETING  Participation Level:  Active  Participation Quality:  Appropriate and Attentive  Affect:  Appropriate  Cognitive:  Alert and Appropriate  Insight:  Appropriate  Engagement in Group:  Engaged  Modes of Intervention:  Discussion and Education  Summary of Progress/Problems:  Brianna Peck 04/13/2020, 10:27 AM

## 2020-04-13 NOTE — Plan of Care (Signed)
Pt rates depression 3/10, anxiety 7/10 and hopelessness 1/10. Pt denies SI, HI and AV. Pt was educated on care plan and verbalizes understanding. Torrie Mayers RN Problem: Education: Goal: Knowledge of [facility] Education information/materials will improve Outcome: Adequate for Discharge Goal: Emotional status will improve Outcome: Adequate for Discharge Goal: Mental status will improve Outcome: Adequate for Discharge Goal: Verbalization of understanding the information provided will improve Outcome: Adequate for Discharge

## 2020-04-13 NOTE — Progress Notes (Signed)
  [facility] Adult Case Management Discharge Plan :  Will you be returning to the same living situation after discharge:  Yes,  lives with parent At discharge, do you have transportation home?: Yes,  pt reports brother will pick up Do you have the ability to pay for your medications: Yes,  BCBS  Release of information consent forms completed and in the chart;  Patient's signature needed at discharge.  Patient to Follow up at:  Follow-up Information    [facility] Follow up.   Why: You are scheduled to meet with Dr. Melina Fiddler on 7/16 at 1pm for an in person appointment. Please bring photo ID, insurance card and hospital discharge paperwork. Thank you. Contact information: [address] [place], [place] [address] [phone] Fax:[phone]        [facility] Follow up.   Why: Walk in clinic hours are Monday-Friday beginning at 830am. Please go to the walk in clinic to complete new patient assessement. Please bring photo ID, insurance card, hospital discharge paperwork. Thank you. Contact information: [address] [place], [place] [address] Phone: [phone] Fax: [phone]              Next level of care provider has access to [facility] Link:no  Safety Planning and Suicide Prevention discussed: Yes,  Dionne Milo, brother  Have you used any form of tobacco in the last 30 days? (Cigarettes, Smokeless Tobacco, Cigars, and/or Pipes): No  Has patient been referred to the Quitline?: N/A patient is not a smoker  Patient has been referred for addiction treatment: N/A  Suzan Slick, LCSW 04/13/2020, 9:59 AM

## 2020-04-13 NOTE — Discharge Summary (Signed)
Physician Discharge Summary Note  Patient:  Brianna Peck is an 29 y.o., female MRN:  [id number] DOB:  27-Dec-1990 Patient phone:  [phone] (home)  Patient address:   [address] [place] [address],  Total Time spent with patient: 30 minutes  Date of Admission:  04/09/2020 Date of Discharge: 04/13/2020  Reason for Admission: Patient was admitted through the emergency room after presenting with a suicide attempt by attempted hanging.  Patient was kept on 15-minute checks and treated for depression.  Outpatient medications for medical issues were continued as best we could.  Patient was initially very sad and tearful dysphoric and anxious.  She remained anxious and dysphoric for a couple days.  Medicines were adjusted by increasing Prozac to 40 mg a day.  Patient did attend groups and interacted appropriately with staff and peers.  By the time of discharge she is much calmer and able to be calm and lucid discussing her situation.  Agrees to outpatient treatment and has been given referral information compatible with her insurance.  Prescriptions given.  Psychoeducation about depression treatment completed.  At the time of discharge denies suicidal ideation does not appear to be an acute safety issue.  Principal Problem: Severe recurrent major depression without psychotic features [facility]) Discharge Diagnoses: Principal Problem:   Severe recurrent major depression without psychotic features (HCC) Active Problems:   Suicide attempt by hanging (HCC)   Insomnia   Attention deficit hyperactivity disorder (ADHD)   Past Psychiatric History: Past history of recurrent episodes of depression and anxiety  Past Medical History:  Past Medical History:  Diagnosis Date   Arthritis    Hydradenitis    Thyroid disease     Past Surgical History:  Procedure Laterality Date   MANDIBLE FRACTURE SURGERY     SINUS EXPLORATION     Family History: History reviewed. No pertinent family  history. Family Psychiatric  History: None reported Social History:  Social History   Substance and Sexual Activity  Alcohol Use Yes     Social History   Substance and Sexual Activity  Drug Use Not on file    Social History   Socioeconomic History   Marital status: Single    Spouse name: Not on file   Number of children: Not on file   Years of education: Not on file   Highest education level: Not on file  Occupational History   Not on file  Tobacco Use   Smoking status: Never Smoker   Smokeless tobacco: Never Used  Substance and Sexual Activity   Alcohol use: Yes   Drug use: Not on file   Sexual activity: Not on file  Other Topics Concern   Not on file  Social History Narrative   Not on file   Social Determinants of Health   Financial Resource Strain:    Difficulty of Paying Living Expenses:   Food Insecurity:    Worried About Programme researcher, broadcasting/film/video in the Last Year:    Barista in the Last Year:   Transportation Needs:    Freight forwarder (Medical):    Lack of Transportation (Non-Medical):   Physical Activity:    Days of Exercise per Week:    Minutes of Exercise per Session:   Stress:    Feeling of Stress :   Social Connections:    Frequency of Communication with Friends and Family:    Frequency of Social Gatherings with Friends and Family:    Attends Religious Services:  Active Member of Clubs or Organizations:    Attends Banker Meetings:    Marital Status:     Hospital Course: See note above.  Patient participated appropriately in groups and in discussions about medication management.  Fluoxetine was increased to 40 mg.  Patient by the time of discharge was denying suicidal ideation and appeared much calmer and more stable.  She was given referral recommendations compatible with her insurance.  Physical Findings: AIMS:  , ,  ,  ,    CIWA:    COWS:     Musculoskeletal: Strength & Muscle Tone:  within normal limits Gait & Station: normal Patient leans: N/A  Psychiatric Specialty Exam: Physical Exam Vitals and nursing note reviewed.  Constitutional:      Appearance: She is well-developed.  HENT:     Head: Normocephalic and atraumatic.  Eyes:     Conjunctiva/sclera: Conjunctivae normal.     Pupils: Pupils are equal, round, and reactive to light.  Cardiovascular:     Heart sounds: Normal heart sounds.  Pulmonary:     Effort: Pulmonary effort is normal.  Abdominal:     Palpations: Abdomen is soft.  Musculoskeletal:        General: Normal range of motion.     Cervical back: Normal range of motion.  Skin:    General: Skin is warm and dry.  Neurological:     General: No focal deficit present.     Mental Status: She is alert.  Psychiatric:        Mood and Affect: Mood normal.        Behavior: Behavior normal.        Thought Content: Thought content normal.        Judgment: Judgment normal.     Review of Systems  Constitutional: Negative.   HENT: Negative.   Eyes: Negative.   Respiratory: Negative.   Cardiovascular: Negative.   Gastrointestinal: Negative.   Musculoskeletal: Negative.   Skin: Negative.   Neurological: Negative.   Psychiatric/Behavioral: Negative.     Blood pressure 118/74, pulse 73, temperature 97.8 F (36.6 C), temperature source Oral, resp. rate 17, height 5\' 2"  (1.575 m), weight 50.3 kg, last menstrual period 04/09/2020, SpO2 100 %.Body mass index is 20.3 kg/m.  General Appearance: Casual  Eye Contact:  Good  Speech:  Clear and Coherent  Volume:  Normal  Mood:  Euthymic  Affect:  Congruent  Thought Process:  Goal Directed  Orientation:  Full (Time, Place, and Person)  Thought Content:  Logical  Suicidal Thoughts:  No  Homicidal Thoughts:  No  Memory:  Immediate;   Fair Recent;   Fair Remote;   Fair  Judgement:  Fair  Insight:  Fair  Psychomotor Activity:  Normal  Concentration:  Concentration: Fair  Recall:  06/10/2020 of  Knowledge:  Fair  Language:  Fair  Akathisia:  No  Handed:  Right  AIMS (if indicated):     Assets:  Desire for Improvement Housing Resilience  ADL's:  Intact  Cognition:  WNL  Sleep:  Number of Hours: 5.25     Have you used any form of tobacco in the last 30 days? (Cigarettes, Smokeless Tobacco, Cigars, and/or Pipes): No  Has this patient used any form of tobacco in the last 30 days? (Cigarettes, Smokeless Tobacco, Cigars, and/or Pipes) Yes, No  Blood Alcohol level:  Lab Results  Component Value Date   ETH <10 04/09/2020    Metabolic Disorder Labs:  No results found  for: HGBA1C, MPG No results found for: PROLACTIN No results found for: CHOL, TRIG, HDL, CHOLHDL, VLDL, LDLCALC  See Psychiatric Specialty Exam and Suicide Risk Assessment completed by Attending Physician prior to discharge.  Discharge destination:  Home  Is patient on multiple antipsychotic therapies at discharge:  No   Has Patient had three or more failed trials of antipsychotic monotherapy by history:  No  Recommended Plan for Multiple Antipsychotic Therapies: NA  Discharge Instructions    Diet - low sodium heart healthy   Complete by: As directed    Increase activity slowly   Complete by: As directed      Allergies as of 04/13/2020      Reactions   Cefzil [cefprozil] Hives      Medication List    STOP taking these medications   nitrofurantoin (macrocrystal-monohydrate) 100 MG capsule Commonly known as: MACROBID     TAKE these medications     Indication  amphetamine-dextroamphetamine 5 MG tablet Commonly known as: ADDERALL Take 2 tablets (10 mg total) by mouth daily with breakfast. Start taking on: April 14, 2020  Indication: Attention Deficit Hyperactivity Disorder   clonazePAM 0.5 MG tablet Commonly known as: KLONOPIN Take 1 tablet (0.5 mg total) by mouth at bedtime. What changed:   when to take this  reasons to take this  Indication: Sleep   FLUoxetine 40 MG capsule Commonly  known as: PROZAC Take 1 capsule (40 mg total) by mouth at bedtime. What changed:   medication strength  how much to take  when to take this  Indication: Depression   fluticasone 50 MCG/ACT nasal spray Commonly known as: FLONASE Place 2 sprays into both nostrils 2 (two) times daily.  Indication: Allergic Rhinitis   Humira 40 MG/0.4ML Pskt Generic drug: Adalimumab Inject 40 mg into the skin once a week.  Indication: Rheumatoid Arthritis   hydrOXYzine 50 MG tablet Commonly known as: ATARAX/VISTARIL Take 1 tablet (50 mg total) by mouth 3 (three) times daily as needed for anxiety. What changed:   when to take this  reasons to take this  Indication: Feeling Anxious   Nurtec 75 MG Tbdp Generic drug: Rimegepant Sulfate Take 1 tablet by mouth daily as needed for migraine.  Indication: Migraine Headache   spironolactone 100 MG tablet Commonly known as: ALDACTONE Take 1 tablet (100 mg total) by mouth at bedtime.  Indication: Common Acne   Sronyx 0.1-20 MG-MCG tablet Generic drug: levonorgestrel-ethinyl estradiol Take 1 tablet by mouth daily.  Indication: Birth Control Treatment        Follow-up recommendations:  Activity:  Activity as tolerated Diet:  Regular diet Other:  Follow-up with outpatient psychiatric and therapy services  Comments: Prescriptions given at discharge.  Patient given referral information for therapy and medication management  Signed: Mordecai Rasmussen, MD 04/13/2020, 9:29 AM

## 2020-05-31 ENCOUNTER — Other Ambulatory Visit: Payer: Self-pay | Admitting: Psychiatry

## 2020-06-17 NOTE — Unmapped (Signed)
PA initiated for Humira via CMM.  Key: [id number]

## 2020-07-12 IMAGING — CT CT ANGIO NECK
2 of 7 series · 8 of 33 positions shown · IV contrast (APPLIED)
Comparison: None.

CLINICAL DATA: Neck trauma due to attempted hanging

EXAM:
CT ANGIOGRAPHY NECK
TECHNIQUE: Multidetector CT imaging of the neck was performed using the
standard protocol during bolus administration of intravenous
contrast. Multiplanar CT image reconstructions and MIPs were
obtained to evaluate the vascular anatomy. Carotid stenosis
measurements (when applicable) are obtained utilizing NASCET
criteria, using the distal internal carotid diameter as the
denominator.
CONTRAST:  75mL OMNIPAQUE IOHEXOL 350 MG/ML SOLN

[Series 2: cta neck · axial · 0.40mm/px · z∈[-232,-150]mm · 2 of 123 slices shown]
[im 41/123  soft-tissue]
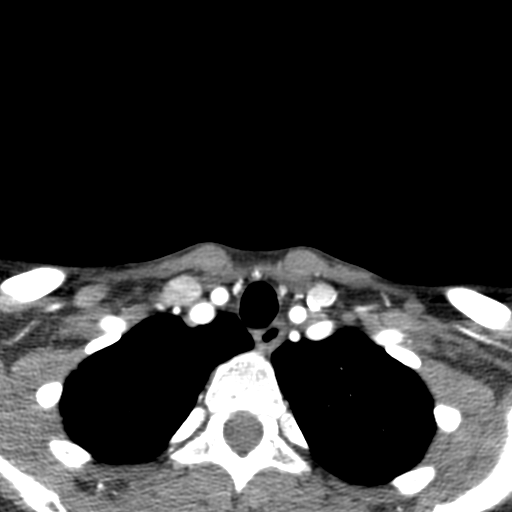
[im 82/123  soft-tissue]
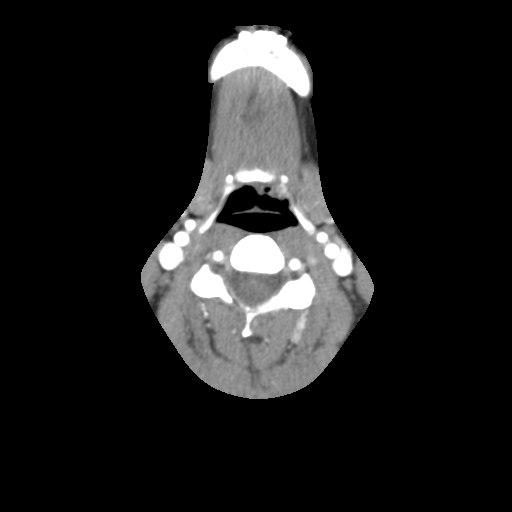

[Series 4: ax thin · axial · 0.33mm/px · z∈[-278,-104]mm · 6 of 244 slices shown]
[im 35/244  soft-tissue]
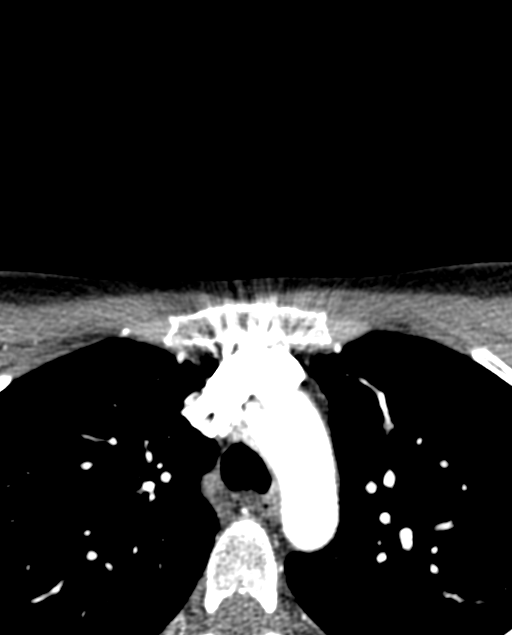
[im 70/244  bone]
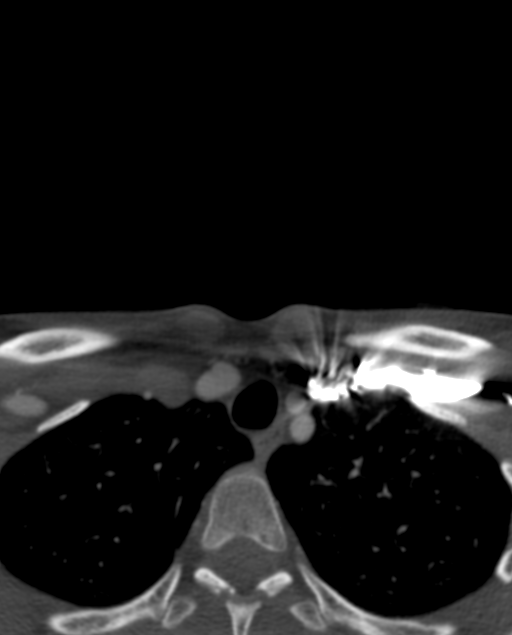
[im 105/244  soft-tissue]
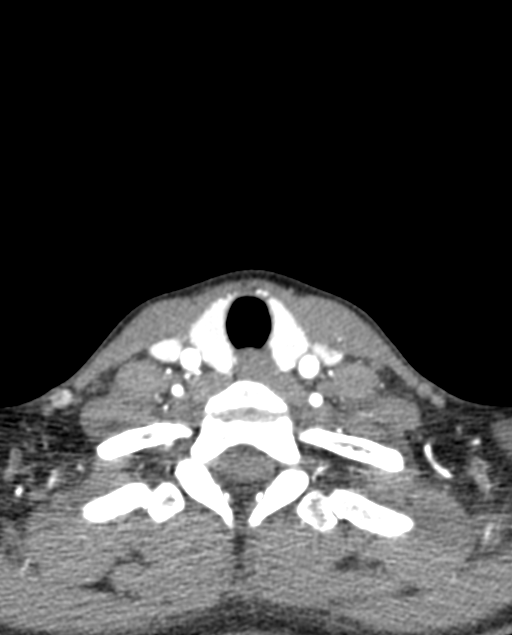
[im 139/244  bone]
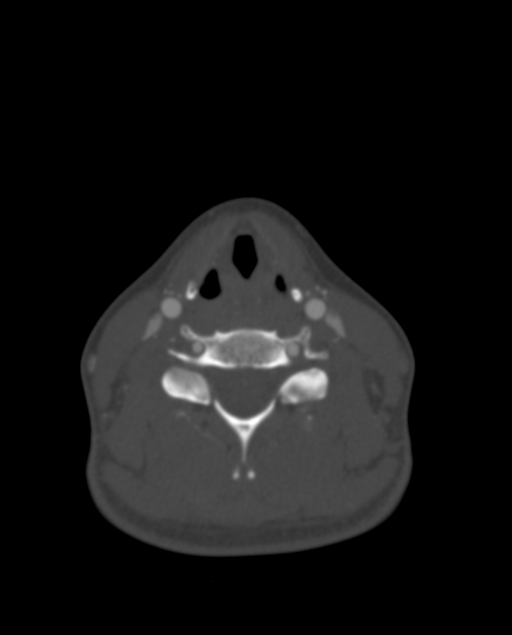
[im 174/244  soft-tissue]
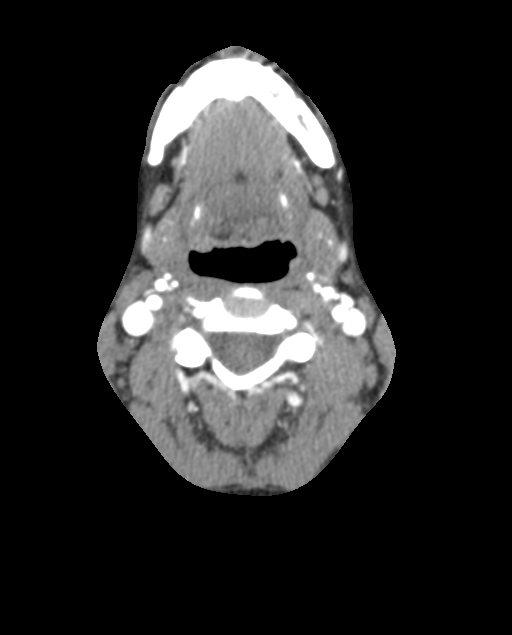
[im 209/244  bone]
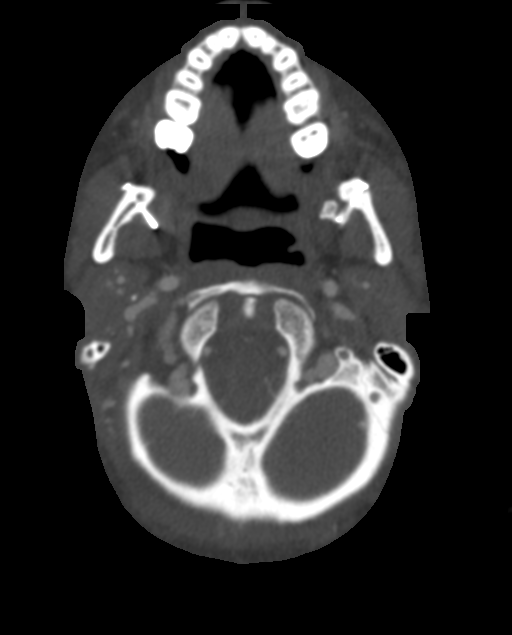

[8 of 33 positions shown; findings below may reference images not displayed]

FINDINGS: Aortic arch: Normal with 3 vessel branching.

Right carotid system: Vessels are smooth and widely patent. No
evidence of injury.

Left carotid system: Vessels are smooth and widely patent. No
evidence of injury.

Vertebral arteries: No proximal subclavian stenosis. The vertebral
arteries are smooth and widely patent.

Skeleton: Postoperative mandible. No evidence of cervical spine or
hyoid fracture.

Other neck: Negative for hematoma.

Upper chest: Negative for apical pneumothorax.
IMPRESSION: Negative neck CTA.

## 2020-08-31 DIAGNOSIS — L409 Psoriasis, unspecified: Principal | ICD-10-CM

## 2020-08-31 MED ORDER — ADALIMUMAB SYRINGE CITRATE FREE 40 MG/0.4 ML
SUBCUTANEOUS | 1 refills | 0.00000 days | Status: CP
Start: 2020-08-31 — End: 2020-10-18

## 2020-08-31 NOTE — Unmapped (Signed)
Called, left voicemail    Thanks!

## 2020-10-18 DIAGNOSIS — L409 Psoriasis, unspecified: Principal | ICD-10-CM

## 2020-10-18 MED ORDER — ADALIMUMAB SYRINGE CITRATE FREE 40 MG/0.4 ML
SUBCUTANEOUS | 5 refills | 0.00000 days | Status: CP
Start: 2020-10-18 — End: ?

## 2020-12-08 NOTE — Unmapped (Unsigned)
Wants appointment changed to after Apr 1 due to gap in insurance coverage.

## 2021-01-09 DIAGNOSIS — L409 Psoriasis, unspecified: Principal | ICD-10-CM

## 2021-01-09 MED ORDER — ADALIMUMAB SYRINGE CITRATE FREE 40 MG/0.4 ML
SUBCUTANEOUS | 5 refills | 0.00000 days | Status: CN
Start: 2021-01-09 — End: ?

## 2021-01-09 NOTE — Unmapped (Signed)
Pt LVM on nurse line requesting that her prescription for humira be sent to her new pharmacy due to a change in insurance coverage.     LOV: 02/03/2020    Medication pended for your review.

## 2021-01-10 NOTE — Unmapped (Signed)
Patient no longer in network for insurance and stated she will be seeing somebody outside our practice next month. 1 month bridge of Humira previously sent.

## 2021-01-10 NOTE — Unmapped (Signed)
1 month Humira sent. Needs appointment for additional refills. Several recently cancelled appointments noted.

## 2021-01-10 NOTE — Unmapped (Signed)
Unfortunately patients insurance is no longer in network. She will be seeing another dermatologist next month    Thanks!

## 2021-01-11 NOTE — Unmapped (Signed)
PA initiated for Humira via CMM.  Key: [id number]????? PA Case ID: [id number] ??? Rx #: [id number]

## 2021-01-13 NOTE — Unmapped (Signed)
PA approval letter for Humira 40 mg pen.  Approval dates 01/12/21-07/13/21    Letter scanned under media tab

## 2021-02-10 NOTE — Unmapped (Signed)
Message from [facility] requesting a new prescription for ADALIMUMAB SYRINGE CITRATE FREE 40 MG/0.4 ML.      Patient last seen 02/03/2020 - no follow up appt scheduled.  Does the patient need an appt for refills?

## 2021-02-14 DIAGNOSIS — L409 Psoriasis, unspecified: Principal | ICD-10-CM

## 2021-02-14 MED ORDER — ADALIMUMAB SYRINGE CITRATE FREE 40 MG/0.4 ML
SUBCUTANEOUS | 0 refills | 0.00000 days | Status: CP
Start: 2021-02-14 — End: ?

## 2021-02-15 NOTE — Unmapped (Signed)
PA initiated for Humira 40mg /0.13ml syringe via CMM.  Key: [id number]? PA Case ID: [id number] ??? Rx #: [id number]

## 2021-02-23 NOTE — Unmapped (Signed)
Received fax stating PA for Humira[CF] 40MG /O.4ML SYRINGhas been approved and is good until 07/18/2021 Approval letter to be scanned into patients chart.

## 2022-02-13 ENCOUNTER — Ambulatory Visit: Admit: 2022-02-13 | Discharge: 2022-02-14

## 2022-02-15 LAB — TB MITOGEN: TB MITOGEN VALUE: 8.68

## 2022-02-15 LAB — TB AG1: TB AG1 VALUE: 0.05

## 2022-02-15 LAB — TB AG2: TB AG2 VALUE: 0.05

## 2022-02-15 LAB — TB NIL: TB NIL VALUE: 0.03

## 2022-02-16 LAB — QUANTIFERON TB GOLD PLUS
QUANTIFERON ANTIGEN 1 MINUS NIL: 0.02 [IU]/mL
QUANTIFERON ANTIGEN 2 MINUS NIL: 0.02 [IU]/mL
QUANTIFERON MITOGEN: 8.65 [IU]/mL
QUANTIFERON TB GOLD PLUS: NEGATIVE
QUANTIFERON TB NIL VALUE: 0.03 [IU]/mL

## 2023-01-30 MED ORDER — HUMIRA SYRINGE CITRATE FREE 40 MG/0.4 ML
SUBCUTANEOUS | 3 refills | 28 days
Start: 2023-01-30 — End: ?

## 2023-01-31 DIAGNOSIS — L409 Psoriasis, unspecified: Principal | ICD-10-CM

## 2023-01-31 MED ORDER — EMPTY CONTAINER
3 refills | 0 days
Start: 2023-01-31 — End: ?

## 2023-01-31 NOTE — Unmapped (Signed)
[facility] Specialty Medication Onboarding    Specialty Medication: HUMIRA(CF) 40 mg/0.4 mL injection (adalimumab)  Prior Authorization: Approved   Financial Assistance: No - copay  <$25  Final Copay/Day Supply: $4 / 28 days    Insurance Restrictions: None     Notes to Pharmacist:   Credit Card on File: no    The triage team has completed the benefits investigation and has determined that the patient is able to fill this medication at [facility]. Please contact the patient to complete the onboarding or follow up with the prescribing physician as needed.

## 2023-01-31 NOTE — Unmapped (Signed)
[facility]   Patient Onboarding/Medication Counseling    Patient has been on Humira but due to insurance change and pharmacy change has had a lapse in therapy since December. Provider does not wish for patient to re-load and is okay for her to continue on maintenance dose   [facility] MyChart Questionnaire Opt In     Ms.Deso is a 32 y.o. female with psoriasis who I am counseling today on continuation of therapy.  I am speaking to the patient.    Was a Nurse, learning disability used for this call? No    Verified patient's date of birth / HIPAA.    Specialty medication(s) to be sent: Inflammatory Disorders: Humira      Non-specialty medications/supplies to be sent: sharps      Medications not needed at this time: n/a         Humira (adalimumab)    The patient declined counseling on medication administration, missed dose instructions, goals of therapy, side effects and monitoring parameters, warnings and precautions, drug/food interactions, and storage, handling precautions, and disposal because they have taken the medication previously. The information in the declined sections below are for informational purposes only and was not discussed with patient.     Medication & Administration     Dosage:  Psoriasis: Inject 1 syringe under the skin once a week    Lab tests required prior to treatment initiation:  Tuberculosis: Tuberculosis screening resulted in a non-reactive Quantiferon TB Gold assay.(Completed: 02/13/2022)  Hepatitis B: Hepatitis B serology studies are complete and non-reactive. (Completed: 04/09/2019)    Administration:     Prefilled syringe  1. Gather all supplies needed for injection on a clean, flat working surface: medication syringe(s) removed from packaging, alcohol swab, sharps container, etc.  2. Look at the medication label - look for correct medication, correct dose, and check the expiration date  3. Look at the medication - the liquid in the syringe should appear clear and colorless  4. Lay the syringe on a flat surface and allow it to warm up to room temperature for at least 30-45 minutes  5. Select injection site - you can use the front of your thigh or your belly (but not the area 2 inches around your belly button)  6. Prepare injection site - wash your hands and clean the skin at the injection site with an alcohol swab and let it air dry, do not touch the injection site again before the injection  7. Pull off the needle safety cap, do not remove until immediately prior to injection; turn the syringe so the needle is facing up and hold the syringe at eye level with one hand so you can see the air in the syringe; using your other hand, slowly push the plunger in to push the air out through the needle  8. Pinch the skin - with your hand not holding the syringe pinch up a fold of skin at the injection site using your forefinger and thumb  9. Insert the needle into the fold of skin at about a 45 degree angle - it's best to use a quick dart-like motion  10.Push the plunger down slowly as far as it will go until the syringe is empty, hold the syringe in place for a full 5 seconds  11. Check that the syringe is empty and pull the needle out at the same angle as inserted  12. Dispose of the used syringe immediately in your sharps disposal container, do not attempt  to recap the needle prior to disposing  13. If you see any blood at the injection site, press a cotton ball or gauze on the site and maintain pressure until the bleeding stops, do not rub the injection site    Adherence/Missed dose instructions:  If your injection is given more than 3 days after your scheduled injection date - consult your pharmacist for additional instructions on how to adjust your dosing schedule.    Goals of Therapy     - Achieve remission/inactive disease or low/minimal disease activity  - Maintenance of function  - Minimization of systemic manifestations and comorbidities  - Maintenance of effective psychosocial functioning    Side Effects & Monitoring Parameters     Injection site reaction (redness, irritation, inflammation localized to the site of administration)  Signs of a common cold - minor sore throat, runny or stuffy nose, etc.  Upset stomach  Headache    The following side effects should be reported to the provider:  Signs of a hypersensitivity reaction - rash; hives; itching; red, swollen, blistered, or peeling skin; wheezing; tightness in the chest or throat; difficulty breathing, swallowing, or talking; swelling of the mouth, face, lips, tongue, or throat; etc.  Reduced immune function - report signs of infection such as fever; chills; body aches; very bad sore throat; ear or sinus pain; cough; more sputum or change in color of sputum; pain with passing urine; wound that will not heal, etc.  Also at a slightly higher risk of some malignancies (mainly skin and blood cancers) due to this reduced immune function.  In the case of signs of infection - the patient should hold the next dose of Humira?? and call your primary care provider to ensure adequate medical care.  Treatment may be resumed when infection is treated and patient is asymptomatic.  Changes in skin - a new growth or lump that forms; changes in shape, size, or color of a previous mole or marking  Signs of unexplained bruising or bleeding - throwing up blood or emesis that looks like coffee grounds; black, tarry, or bloody stool; etc.  Signs of new or worsening heart failure - shortness of breath; sudden weight gain; heartbeat that is not normal; swelling in the arms or legs that is new or worse      Contraindications, Warnings, & Precautions     Have your bloodwork checked as you have been told by your prescriber  Talk with your doctor if you are pregnant, planning to become pregnant, or breastfeeding  Discuss the possible need for holding your dose(s) of Humira?? when a planned procedure is scheduled with the prescriber as it may delay healing/recovery timeline       Drug/Food Interactions     Medication list reviewed in Epic. The patient was instructed to inform the care team before taking any new medications or supplements. No drug interactions identified.   Talk with you prescriber or pharmacist before receiving any live vaccinations while taking this medication and after you stop taking it    Storage, Handling Precautions, & Disposal     Store this medication in the refrigerator.  Do not freeze  If needed, you may store at room temperature for up to 14 days  Store in original packaging, protected from light  Do not shake  Dispose of used syringes/pens in a sharps disposal container          Current Medications (including OTC/herbals), Comorbidities and Allergies     Current Outpatient Medications  Medication Sig Dispense Refill    ADALIMUMAB SYRINGE CITRATE FREE 40 MG/0.4 ML Inject contents of one syringe (40mg ) every 7 days 4 each 0    budesonide-formoteroL (SYMBICORT) 80-4.5 mcg/actuation inhaler Symbicort 80 mcg-4.5 mcg/actuation HFA aerosol inhaler   INHALE 2 INHALATIONS INTO THE LUNGS 2 TIMES DAILY      cetirizine (ZYRTEC) 10 MG tablet cetirizine 10 mg tablet   TAKE 1 TABLET BY MOUTH EVERY DAY      clonazePAM (KLONOPIN) 0.5 MG tablet Take 0.5 mg by mouth.      cyclobenzaprine (FLEXERIL) 5 MG tablet cyclobenzaprine 5 mg tablet   TAKE 1 TABLET 2 HOURS BEFORE BEDTIME      doxepin (SINEQUAN) 25 MG capsule Take 1 capsule (25 mg total) by mouth nightly. 30 capsule 3    estradioL (ESTRACE) 0.01 % (0.1 mg/gram) vaginal cream estradiol 0.01% (0.1 mg/gram) vaginal cream   1 g nightly x 2 weeks, then 3x/week.      FLUoxetine (PROZAC) 20 MG capsule Take 20 mg by mouth.      fluticasone propionate (FLONASE) 50 mcg/actuation nasal spray 2 sprays by Each Nare route daily as needed.      gabapentin (NEURONTIN) 300 MG capsule Take 1 capsule (300 mg total) by mouth Three (3) times a day. 180 capsule 3    HUMIRA SYRINGE CITRATE FREE 40 MG/0.4 ML Inject the contents of 1 pen (40 mg total) under the skin once a week. 4 each 3    hydrOXYzine (ATARAX) 50 MG tablet Take 50 mg by mouth.      ketoconazole (NIZORAL) 2 % shampoo Use up to daily as needed 120 mL 11    levonorgestrel-ethinyl estradiol (AVIANE,ALESSE,LESSINA) 0.1-20 mg-mcg per tablet Take 1 tablet by mouth daily.      nitrofurantoin, macrocrystal-monohydrate, (MACROBID) 100 MG capsule Take 100 mg by mouth.      olopatadine 0.6 % Spry olopatadine 0.6 % nasal spray   SPRAY 2 SPRAYS INTO EACH NOSTRIL TWICE A DAY      ondansetron (ZOFRAN) 4 MG tablet Take 4 mg by mouth every eight (8) hours as needed.  0    rimegepant (NURTEC ODT) 75 mg TbDL Take 1 tablet by mouth.      spironolactone (ALDACTONE) 100 MG tablet Take 1 tablet (100 mg total) by mouth daily. 90 tablet 3     No current facility-administered medications for this visit.       Allergies   Allergen Reactions    Cefprozil Hives    Sulfamethoxazole-Trimethoprim Hives    Clindamycin Rash    Lorazepam Other (See Comments)     Other reaction(s): Other (See Comments)  Other Reaction: INCREASES AGITATION  Other reaction(s): Other (See Comments)  Other Reaction: INCREASES AGITATION  Other Reaction: INCREASES AGITATION  Other Reaction: INCREASES AGITATION      Sulfa (Sulfonamide Antibiotics) Rash       Patient Active Problem List   Diagnosis    Allergic rhinitis due to pollen    Chronic maxillary sinusitis    Chronic sinusitis    Hip pain, bilateral    Infective otitis externa    Knee pain, bilateral    Migraine without status migrainosus, not intractable    Nasal obstruction    S/P nasal septoplasty    S/P tonsillectomy    Tonsillitis    Tonsillitis, chronic    Post concussive syndrome    Cocaine use, unspecified, uncomplicated    Alcohol use    Benzodiazepine misuse    Emotional lability  Head injury    Major depressive disorder       Reviewed and up to date in Epic.    Appropriateness of Therapy     Acute infections noted within Epic:  No active infections  Patient reported infection: None    Is medication and dose appropriate based on diagnosis and infection status? Yes    Prescription has been clinically reviewed: Yes      Baseline Quality of Life Assessment      How many days over the past month did your psoriasis  keep you from your normal activities? For example, brushing your teeth or getting up in the morning. Patient declined to answer    Financial Information     Medication Assistance provided: Prior Authorization    Anticipated copay of $4 reviewed with patient. Verified delivery address.    Delivery Information     Scheduled delivery date: 5/6    Expected start date: 5/6      Medication will be delivered via Same Day Courier to the prescription address in [facility].  This shipment will not require a signature.      Explained the services we provide at [facility] and that each month we would call to set up refills.  Stressed importance of returning phone calls so that we could ensure they receive their medications in time each month.  Informed patient that we should be setting up refills 7-10 days prior to when they will run out of medication.  A pharmacist will reach out to perform a clinical assessment periodically.  Informed patient that a welcome packet, containing information about our pharmacy and other support services, a Notice of Privacy Practices, and a drug information handout will be sent.      The patient or caregiver noted above participated in the development of this care plan and knows that they can request review of or adjustments to the care plan at any time.      Patient or caregiver verbalized understanding of the above information as well as how to contact the pharmacy at [phone] option 4 with any questions/concerns.  The pharmacy is open Monday through Friday 8:30am-4:30pm.  A pharmacist is available 24/7 via pager to answer any clinical questions they may have.    Patient Specific Needs     Does the patient have any physical, cognitive, or cultural barriers? No    Does the patient have adequate living arrangements? (i.e. the ability to store and take their medication appropriately) Yes    Did you identify any home environmental safety or security hazards? No    Patient prefers to have medications discussed with  Patient     Is the patient or caregiver able to read and understand education materials at a high school level or above? Yes    Patient's primary language is  English     Is the patient high risk? No    SOCIAL DETERMINANTS OF HEALTH     At the [facility], we have learned that life circumstances - like trouble affording food, housing, utilities, or transportation can affect the health of many of our patients.   That is why we wanted to ask: are you currently experiencing any life circumstances that are negatively impacting your health and/or quality of life? No    Social Determinants of Psychologist, prison and probation services Strain: Not on file   Internet Connectivity: Not on file   Food Insecurity: Not on file  Tobacco Use: Low Risk  (10/15/2020)    Received from [facility]    Patient History     Smoking Tobacco Use: Never     Smokeless Tobacco Use: Never     Passive Exposure: Not on file   Housing/Utilities: Not on file   Alcohol Use: Not on file   Transportation Needs: Not on file   Substance Use: Not on file   Health Literacy: Not on file   Physical Activity: Not on file   Interpersonal Safety: Not on file   Stress: Not on file   Intimate Partner Violence: Not on file   Depression: Not on file   Social Connections: Not on file       Would you be willing to receive help with any of the needs that you have identified today? Not applicable       Teofilo Pod, PharmD  [facility] Specialty Pharmacist

## 2023-02-04 MED FILL — EMPTY CONTAINER: 120 days supply | Qty: 1 | Fill #0

## 2023-02-04 MED FILL — HUMIRA SYRINGE CITRATE FREE 40 MG/0.4 ML: SUBCUTANEOUS | 28 days supply | Qty: 4 | Fill #0

## 2023-02-26 NOTE — Unmapped (Signed)
[facility] Clinical Assessment & Refill Coordination Note    Patient has two doses left on-hand for 5/29 & 6/5. Currently in [place] and will need next shipment for dose due on 6/12 - agreed upon delivery for Friday, 6/7    Teresa Butler, DOB: 1991-05-28  Phone: [phone] (home)     All above HIPAA information was verified with patient.     Was a Nurse, learning disability used for this call? No    Specialty Medication(s):   Inflammatory Disorders: Humira     Current Outpatient Medications   Medication Sig Dispense Refill    ADALIMUMAB SYRINGE CITRATE FREE 40 MG/0.4 ML Inject contents of one syringe (40mg ) every 7 days 4 each 0    cetirizine (ZYRTEC) 10 MG tablet cetirizine 10 mg tablet   TAKE 1 TABLET BY MOUTH EVERY DAY      cyanocobalamin, vitamin B-12, 1,000 mcg/mL injection Inject into the muscle every thirty (30) days.      empty container Misc Use as directed to dispose of Humira 1 each 3    FLUoxetine (PROZAC) 20 MG capsule Take 1 capsule (20 mg total) by mouth.      fluticasone propionate (FLONASE) 50 mcg/actuation nasal spray 2 sprays into each nostril daily as needed.      HUMIRA SYRINGE CITRATE FREE 40 MG/0.4 ML Inject the contents of 1 pen (40 mg total) under the skin once a week. 4 each 3    ketoconazole (NIZORAL) 2 % shampoo Use up to daily as needed 120 mL 11    levonorgestrel-ethinyl estradiol (AVIANE,ALESSE,LESSINA) 0.1-20 mg-mcg per tablet Take 1 tablet by mouth daily.      methazolAMIDE (NEPTAZANE) 25 MG tablet Take 1 tablet (25 mg total) by mouth Three (3) times a day.      olopatadine 0.6 % Spry olopatadine 0.6 % nasal spray   SPRAY 2 SPRAYS INTO EACH NOSTRIL TWICE A DAY      ondansetron (ZOFRAN) 4 MG tablet Take 1 tablet (4 mg total) by mouth every eight (8) hours as needed.  0    rimegepant (NURTEC ODT) 75 mg TbDL Take 1 tablet by mouth.      spironolactone (ALDACTONE) 100 MG tablet Take 1 tablet (100 mg total) by mouth daily. 90 tablet 3     No current facility-administered medications for this visit.        Changes to medications: Teresa Butler reports no changes at this time.    Allergies   Allergen Reactions    Cefprozil Hives    Sulfamethoxazole-Trimethoprim Hives    Clindamycin Rash    Lorazepam Other (See Comments)     Other reaction(s): Other (See Comments)  Other Reaction: INCREASES AGITATION  Other reaction(s): Other (See Comments)  Other Reaction: INCREASES AGITATION  Other Reaction: INCREASES AGITATION  Other Reaction: INCREASES AGITATION      Sulfa (Sulfonamide Antibiotics) Rash       Changes to allergies: No    SPECIALTY MEDICATION ADHERENCE     Humira 40  mg/0.38mL : 2 doses of medicine on hand        Specialty medication(s) dose(s) confirmed: Regimen is correct and unchanged.     Are there any concerns with adherence? No    Adherence counseling provided? Not needed    CLINICAL MANAGEMENT AND INTERVENTION      Clinical Benefit Assessment:    Do you feel the medicine is effective or helping your condition? Yes    Clinical Benefit counseling provided? Not needed  Adverse Effects Assessment:    Are you experiencing any side effects? No    Are you experiencing difficulty administering your medicine? No    Quality of Life Assessment:    Quality of Life    Rheumatology  Oncology  Dermatology  Cystic Fibrosis          How many days over the past month did your psoriasis  keep you from your normal activities? For example, brushing your teeth or getting up in the morning. 0    Have you discussed this with your provider? Not needed    Acute Infection Status:    Acute infections noted within Epic:  No active infections  Patient reported infection: None    Therapy Appropriateness:    Is therapy appropriate and patient progressing towards therapeutic goals? Yes, therapy is appropriate and should be continued    DISEASE/MEDICATION-SPECIFIC INFORMATION      For patients on injectable medications: Patient currently has 2 doses left.  Next injection is scheduled for 5/29 & 6/5.    Chronic Inflammatory Diseases: Have you experienced any flares in the last month? No  Has this been reported to your provider? Not applicable    PATIENT SPECIFIC NEEDS     Does the patient have any physical, cognitive, or cultural barriers? No    Is the patient high risk? No    Did the patient require a clinical intervention? No    Does the patient require physician intervention or other additional services (i.e., nutrition, smoking cessation, social work)? No    SOCIAL DETERMINANTS OF HEALTH     At the [facility], we have learned that life circumstances - like trouble affording food, housing, utilities, or transportation can affect the health of many of our patients.   That is why we wanted to ask: are you currently experiencing any life circumstances that are negatively impacting your health and/or quality of life? No    Social Determinants of Psychologist, prison and probation services Strain: Not on file   Internet Connectivity: Not on file   Food Insecurity: Not on file   Tobacco Use: Low Risk  (10/15/2020)    Received from [facility]    Patient History     Smoking Tobacco Use: Never     Smokeless Tobacco Use: Never     Passive Exposure: Not on file   Housing/Utilities: Not on file   Alcohol Use: Not on file   Transportation Needs: Not on file   Substance Use: Not on file   Health Literacy: Not on file   Physical Activity: Not on file   Interpersonal Safety: Not on file   Stress: Not on file   Intimate Partner Violence: Not on file   Depression: Not on file   Social Connections: Not on file       Would you be willing to receive help with any of the needs that you have identified today? Not applicable       SHIPPING     Specialty Medication(s) to be Shipped:   Inflammatory Disorders: Humira    Other medication(s) to be shipped: No additional medications requested for fill at this time     Changes to insurance: No    Delivery Scheduled: Yes, Expected medication delivery date: 6/7.     Medication will be delivered via Same Day Courier to the confirmed prescription address in [facility].    The patient will receive a drug information handout for each medication shipped and additional  FDA Medication Guides as required.  Verified that patient has previously received a Conservation officer, historic buildings and a Surveyor, mining.    The patient or caregiver noted above participated in the development of this care plan and knows that they can request review of or adjustments to the care plan at any time.      All of the patient's questions and concerns have been addressed.    Teofilo Pod, PharmD   [facility] Specialty Pharmacist

## 2023-03-08 MED FILL — HUMIRA SYRINGE CITRATE FREE 40 MG/0.4 ML: SUBCUTANEOUS | 28 days supply | Qty: 4 | Fill #1

## 2023-03-20 DIAGNOSIS — L409 Psoriasis, unspecified: Principal | ICD-10-CM

## 2023-03-27 NOTE — Unmapped (Signed)
The [facility] has made a second and final attempt to reach this patient to refill the following medication:HUMIRA(CF) 40 mg/0.4 mL injection (adalimumab).      We have left voicemails on the following phone numbers: [phone] and have sent a text message to the following phone numbers: [phone] .    Dates contacted: 6/19, 6/26  Last scheduled delivery: 6/7-28ds    The patient may be at risk of non-compliance with this medication. The patient should call the [facility] at [phone]  Option 4, then Option 2: Dermatology, Gastroenterology, Rheumatology to refill medication.    Gaspar Cola [facility] Specialty Technician

## 2023-04-01 ENCOUNTER — Ambulatory Visit: Admit: 2023-04-01 | Discharge: 2023-04-02 | Payer: PRIVATE HEALTH INSURANCE

## 2023-05-14 NOTE — Unmapped (Signed)
[facility] Refill Coordination Note    Specialty Medication(s) to be Shipped:   Inflammatory Disorders: Humira    Other medication(s) to be shipped: No additional medications requested for fill at this time     Teresa Butler, DOB: 03/14/1991  Phone: [phone] (home)       All above HIPAA information was verified with patient.     Was a Nurse, learning disability used for this call? No    Completed refill call assessment today to schedule patient's medication shipment from the [facility] [phone]).  All relevant notes have been reviewed.     Specialty medication(s) and dose(s) confirmed: Regimen is correct and unchanged.   Changes to medications: Teresa Butler reports no changes at this time.  Changes to insurance: No  New side effects reported not previously addressed with a pharmacist or physician: None reported  Questions for the pharmacist: No    Confirmed patient received a Conservation officer, historic buildings and a Surveyor, mining with first shipment. The patient will receive a drug information handout for each medication shipped and additional FDA Medication Guides as required.       DISEASE/MEDICATION-SPECIFIC INFORMATION        For patients on injectable medications: Patient currently has 1  doses left.  Next injection is scheduled for 05/14/23 .    SPECIALTY MEDICATION ADHERENCE     Medication Adherence    Patient reported X missed doses in the last month: 0  Specialty Medication: HUMIRA(CF) 40 mg/0.4 mL injection (adalimumab)  Patient is on additional specialty medications: No  Patient is on more than two specialty medications: No  Any gaps in refill history greater than 2 weeks in the last 3 months: no  Demonstrates understanding of importance of adherence: yes              Were doses missed due to medication being on hold? Yes - due to  Covid        HUMIRA(CF) 40 mg/0.4 mL : 7  days of medicine on hand        REFERRAL TO PHARMACIST     Referral to the pharmacist: Not needed      [facility]     Shipping address confirmed in Epic.       Delivery Scheduled: Yes, Expected medication delivery date: 05/17/23.     Medication will be delivered via Same Day Courier to the prescription address in Epic [facility].    Teresa Butler   [facility] Specialty Technician

## 2023-05-17 MED FILL — HUMIRA SYRINGE CITRATE FREE 40 MG/0.4 ML: SUBCUTANEOUS | 28 days supply | Qty: 4 | Fill #2

## 2023-06-14 NOTE — Unmapped (Signed)
The [facility] has made a second and final attempt to reach this patient to refill the following medication:HUMIRA(CF) 40 mg/0.4 mL injection (adalimumab).      We have left voicemails on the following phone numbers:  [phone] and have sent a text message to the following phone numbers:  [phone] .    Dates contacted: 9/6, 9/13  Last scheduled delivery: 8/16- 28ds    The patient may be at risk of non-compliance with this medication. The patient should call the [facility] at [phone]  Option 4, then Option 2: Dermatology, Gastroenterology, Rheumatology to refill medication.    [facility] Specialty Technician

## 2023-07-23 NOTE — Unmapped (Signed)
[facility] Refill Coordination Note    Specialty Medication(s) to be Shipped:   Inflammatory Disorders: Humira    Other medication(s) to be shipped: No additional medications requested for fill at this time     Teresa Butler, DOB: [birth year]  Phone: [phone] (home)       All above HIPAA information was verified with patient.     Was a Nurse, learning disability used for this call? No    Completed refill call assessment today to schedule patient's medication shipment from the [facility]  [phone]).  All relevant notes have been reviewed.     Specialty medication(s) and dose(s) confirmed: Regimen is correct and unchanged.   Changes to medications: Yairi reports no changes at this time.  Changes to insurance: No  New side effects reported not previously addressed with a pharmacist or physician: None reported  Questions for the pharmacist: No    Confirmed patient received a Conservation officer, historic buildings and a Surveyor, mining with first shipment. The patient will receive a drug information handout for each medication shipped and additional FDA Medication Guides as required.       DISEASE/MEDICATION-SPECIFIC INFORMATION        For patients on injectable medications: Patient currently has 0 doses left.  Next injection is scheduled for 07/23/2023.    SPECIALTY MEDICATION ADHERENCE     Medication Adherence    Patient reported X missed doses in the last month: 1  Specialty Medication: HUMIRA(CF) 40 mg/0.4 mL injection (adalimumab)  Patient is on additional specialty medications: No  Informant: patient              Were doses missed due to medication being on hold? No      HUMIRA(CF) 40 mg/0.4 mL injection (adalimumab): 0 doses of medicine on hand       REFERRAL TO PHARMACIST     Referral to the pharmacist: Not needed      [facility]     Shipping address confirmed in Epic.       Delivery Scheduled: Yes, Expected medication delivery date: 07/25/23.     Medication will be delivered via Same Day Courier to the prescription address in Epic WAM.    [facility]  Specialty Technician

## 2023-07-25 NOTE — Unmapped (Signed)
Teresa Butler 's HUMIRA(CF) 40 mg/0.4 mL injection (adalimumab) shipment will be delayed as a result of  Humira no longer covered    I have reached out to the patient  at [phone]  and communicated the delay. We will call the patient back to reschedule the delivery upon resolution. We have not confirmed the new delivery date.

## 2023-10-03 NOTE — Unmapped (Signed)
Specialty Medication(s): Humira    [name] has been dis-enrolled from the [employer] [facility] specialty pharmacy services due to multiple unsuccessful outreach attempts by the pharmacy.    Additional information provided to the patient: Patient can contact [facility] directly to re enroll in pharmacy services    Teresa Butler, PharmD  [facility] Specialty Pharmacist

## 2023-10-23 MED ORDER — METHOTREXATE SODIUM (CONTAINS PRESERVATIVES) 25 MG/ML INJECTION SOLUTION
SUBCUTANEOUS | 2 refills | 21.00 days
Start: 2023-10-23 — End: ?

## 2023-10-23 MED FILL — METHOTREXATE SODIUM (CONTAINS PRESERVATIVES) 25 MG/ML INJECTION SOLUTION: SUBCUTANEOUS | 21 days supply | Qty: 2 | Fill #0

## 2023-11-13 MED FILL — METHOTREXATE SODIUM (CONTAINS PRESERVATIVES) 25 MG/ML INJECTION SOLUTION: SUBCUTANEOUS | 21 days supply | Qty: 2 | Fill #1

## 2024-03-18 DIAGNOSIS — L409 Psoriasis, unspecified: Principal | ICD-10-CM

## 2024-03-18 MED ORDER — HADLIMA(CF) 40 MG/0.4 ML SUBCUTANEOUS SYRINGE
3 refills | 0.00000 days
Start: 2024-03-18 — End: ?

## 2024-03-20 NOTE — Unmapped (Signed)
 [facility] Specialty Medication Onboarding    Specialty Medication: HADLIMA (CF) 40 mg/0.4 mL Syrg (adalimumab -bwwd)  Prior Authorization: Not Required   Financial Assistance: Yes - copay card approved as secondary   Final Copay/Day Supply: $0 / 28    Insurance Restrictions: Yes - max 1 month supply     Notes to Pharmacist:   Credit Card on File: not applicable  Start Date on Rx:  03/18/24    The triage team has completed the benefits investigation and has determined that the patient is able to fill this medication at [facility]. Please contact the patient to complete the onboarding or follow up with the prescribing physician as needed.

## 2024-03-20 NOTE — Unmapped (Signed)
 [facility]    Patient Onboarding/Medication Counseling    Teresa Butler is a 33 y.o. female with psoriasis who I am counseling today on continuation of therapy.  I am speaking to the patient.    Was a Nurse, learning disability used for this call? No    Verified patient's date of birth / HIPAA.    Specialty medication(s) to be sent: Inflammatory Disorders: Hadlima       Non-specialty medications/supplies to be sent: None      Medications not needed at this time: n/a         Hadlima  (adalimumab -bwwd)  The patient declined counseling on medication administration, missed dose instructions, goals of therapy, side effects and monitoring parameters, warnings and precautions, drug/food interactions, and storage, handling precautions, and disposal because they have taken the medication previously. The information in the declined sections below are for informational purposes only and was not discussed with patient.     Medication & Administration     Dosage: Psoriasis: Inject 40mg  under the skin every 7 days    Lab tests required prior to treatment initiation:  Tuberculosis: Tuberculosis screening resulted in a non-reactive Quantiferon TB Gold assay. 02/13/2022  Hepatitis B: Hepatitis B serology studies are complete and non-reactive. 03/09/2021    Administration:     Prefilled syringe   1. Gather all supplies needed for injection on a clean, flat working surface: medication syringe(s) removed from packaging, alcohol swab, sharps container, etc.  2. Look at the medication label - look for correct medication, correct dose, and check the expiration date  3. Look at the medication - the liquid in the syringe should appear clear and colorless  4. Lay the syringe on a flat surface and allow it to warm up to room temperature for at least 15-30 minutes  5. Select injection site - you can use the front of your thigh or your belly (but not the area 2 inches around your belly button)  6. Prepare injection site - wash your hands and clean the skin at the injection site with an alcohol swab and let it air dry, do not touch the injection site again before the injection  7. Pull off the needle safety cap, do not remove until immediately prior to injection; turn the syringe so the needle is facing up; using your other hand, slowly push the plunger in to push the air out through the needle  8. Pinch the skin - with your hand not holding the syringe pinch up a fold of skin at the injection site using your forefinger and thumb  9. Insert the needle into the fold of skin at about a 45 degree angle - it's best to use a quick dart-like motion  10.Push the plunger down slowly as far as it will go until the syringe is empty, hold the syringe in place for a full 5 seconds  11. Check that the syringe is empty and pull the needle out at the same angle as inserted. Make sure that the needle has retracted.   12. Dispose of the used syringe immediately in your sharps disposal container, do not attempt to recap the needle prior to disposing  13. If you see any blood at the injection site, press a cotton ball or gauze on the site and maintain pressure until the bleeding stops, do not rub the injection site    Prefilled Auto-injector pen  1. Gather all supplies needed for injection on a clean, flat working surface: medication pen removed  from packaging, alcohol swab, sharps container, etc.  2. Look at the medication label - look for correct medication, correct dose, and check the expiration date  3. Look at the medication - the liquid visible in the window on the side of the pen device should appear clear and colorless  4. Lay the auto-injector pen on a flat surface and allow it to warm up to room temperature for at least 15-30 minutes  5. Select injection site - you can use the front of your thigh or your belly (but not the area 2 inches around your belly button); if someone else is giving you the injection you can also use your upper arm in the skin covering your triceps muscle  6. Prepare injection site - wash your hands and clean the skin at the injection site with an alcohol swab and let it air dry, do not touch the injection site again before the injection  7. Pull off the clear needle cap with a metal center straight off and do not remove until immediately prior to injection and do not touch the white needle cover  8. Gently squeeze the area of cleaned skin and hold it firmly to create a firm surface at the selected injection site  9. Put the green base against your skin at the injection site at a 90 degree angle, hold the pen such that you can see the clear medication window  10. Press down and hold the pen firmly against your skin, when you push down the injection will start, there will be a click when the injection starts  11. Continue to hold the pen firmly against your skin for about 10-15 seconds - the window will start to turn solid yellow  12. To verify the injection is complete after 10-15 seconds, look and ensure the window is solid yellow and you may hear a second click. This means the injection is complete.  Then pull the pen away from your skin. The needle should be covered by the green base.   13. Dispose of the used auto-injector pen immediately in your sharps disposal container the needle will be covered automatically  14. If you see any blood at the injection site, press a cotton ball or gauze on the site and maintain pressure until the bleeding stops, do not rub the injection site      Adherence/Missed dose instructions:  If your injection is given more than 3 days after your scheduled injection date - consult your pharmacist for additional instructions on how to adjust your dosing schedule.    Goals of Therapy     - Achieve remission/inactive disease or low/minimal disease activity  - Maintenance of function  - Minimization of systemic manifestations and comorbidities  - Maintenance of effective psychosocial functioning      Side Effects & Monitoring Parameters     Injection site reaction (redness, irritation, inflammation localized to the site of administration)  Signs of a common cold - minor sore throat, runny or stuffy nose, etc.  Upset stomach  Headache  Rash    The following side effects should be reported to the provider:  Signs of a hypersensitivity reaction - rash; hives; itching; red, swollen, blistered, or peeling skin; wheezing; tightness in the chest or throat; difficulty breathing, swallowing, or talking; swelling of the mouth, face, lips, tongue, or throat; etc.  Reduced immune function - report signs of infection such as fever; chills; body aches; very bad sore throat; ear or sinus pain; cough; more  sputum or change in color of sputum; pain with passing urine; wound that will not heal, etc.  Also at a slightly higher risk of some malignancies (mainly skin and blood cancers) due to this reduced immune function.  In the case of signs of infection - the patient should hold the next dose of Hadlima ?? and call your primary care provider to ensure adequate medical care.  Treatment may be resumed when infection is treated and patient is asymptomatic.  Changes in skin - a new growth or lump that forms; changes in shape, size, or color of a previous mole or marking  Signs of unexplained bruising or bleeding - throwing up blood or emesis that looks like coffee grounds; black, tarry, or bloody stool; etc.  Signs of new or worsening heart failure - shortness of breath; sudden weight gain; heartbeat that is not normal; swelling in the arms or legs that is new or worse      Contraindications, Warnings, & Precautions     Have your bloodwork checked as you have been told by your prescriber  Talk with your doctor if you are pregnant, planning to become pregnant, or breastfeeding  Discuss the possible need for holding your dose(s) of Hadlima ?? when a planned procedure is scheduled with the prescriber as it may delay healing/recovery timeline Drug/Food Interactions     Medication list reviewed in Epic. The patient was instructed to inform the care team before taking any new medications or supplements. No drug interactions identified.   Talk with you prescriber or pharmacist before receiving any live vaccinations while taking this medication and after you stop taking it    Storage, Handling Precautions, & Disposal     Store this medication in the refrigerator.  Do not freeze  If needed, you may store at room temperature for up to 14 days  Store in original packaging, protected from light  Do not shake  Dispose of used syringes/pens in a sharps disposal container      Current Medications (including OTC/herbals), Comorbidities and Allergies     Current Medications[1]    Allergies[2]    Problem List[3]    Medication list has been reviewed and updated in Epic: Yes    Allergies have been reviewed and updated in Epic: Yes    Appropriateness of Therapy     Acute infections noted within Epic:  No active infections  Patient reported infection: None    Is the medication and dose appropriate based on diagnosis, medication list, comorbidities, allergies, medical history, patient???s ability to self-administer the medication, and therapeutic goals? Yes    Prescription has been clinically reviewed: Yes      Baseline Quality of Life Assessment      How many days over the past month did your condition  keep you from your normal activities? For example, brushing your teeth or getting up in the morning. 0    Financial Information     Medication Assistance provided: Prior Authorization and Copay Assistance    Anticipated copay of $0 / 28DS reviewed with patient. Verified delivery address.    Delivery Information     Scheduled delivery date: 03/24/2024    Expected start date: 03/24/2024      Medication will be delivered via Same Day Courier to the prescription address in [facility].  This shipment will not require a signature.      Explained the services we provide at [facility] and that each month we would call to set  up refills.  Stressed importance of returning phone calls so that we could ensure they receive their medications in time each month.  Informed patient that we should be setting up refills 7-10 days prior to when they will run out of medication.  A pharmacist will reach out to perform a clinical assessment periodically.  Informed patient that a welcome packet, containing information about our pharmacy and other support services, a Notice of Privacy Practices, and a drug information handout will be sent.      The patient or caregiver noted above participated in the development of this care plan and knows that they can request review of or adjustments to the care plan at any time.      Patient or caregiver verbalized understanding of the above information as well as how to contact the pharmacy at [phone] option 4 with any questions/concerns.  The pharmacy is open Monday through Friday 8:30am-4:30pm.  A pharmacist is available 24/7 via pager to answer any clinical questions they may have.    Patient Specific Needs     Does the patient have any physical, cognitive, or cultural barriers? No    Does the patient have adequate living arrangements? (i.e. the ability to store and take their medication appropriately) Yes    Did you identify any home environmental safety or security hazards? No    Patient prefers to have medications discussed with  Patient     Is the patient or caregiver able to read and understand education materials at a high school level or above? Yes    Patient's primary language is  English     Is the patient high risk? No    Does the patient have an additional or emergency contact listed in their chart? Yes    SOCIAL DETERMINANTS OF HEALTH     At the [facility], we have learned that life circumstances - like trouble affording food, housing, utilities, or transportation can affect the health of many of our patients. That is why we wanted to ask: are you currently experiencing any life circumstances that are negatively impacting your health and/or quality of life? No    Social Drivers of Health     Food Insecurity: Patient Declined (06/20/2020)    Received from [facility]    Hunger Vital Sign     Worried About Running Out of Food in the Last Year: Patient declined     Ran Out of Food in the Last Year: Patient declined   Tobacco Use: Low Risk  (07/24/2023)    Received from [facility]    Patient History     Smoking Tobacco Use: Never     Smokeless Tobacco Use: Never     Passive Exposure: Not on file   Transportation Needs: No Transportation Needs (06/20/2020)    Received from [facility] - Transportation     In the past 12 months, has lack of transportation kept you from medical appointments or from getting medications?: No     Lack of Transportation (Non-Medical): No   Alcohol Use: Not on file   Housing: Not on file   Physical Activity: Insufficiently Active (06/20/2020)    Received from [facility]    Exercise Vital Sign     Days of Exercise per Week: 4 days     Minutes of Exercise per Session: 30 min   Utilities: Not on file   Stress: No Stress Concern Present (  06/20/2020)    Received from [facility] - Occupational Stress Questionnaire     Feeling of Stress : Only a little   Interpersonal Safety: Not on file   Substance Use: Not on file (08/05/2023)   Intimate Partner Violence: Not on file   Social Connections: Not on file   Financial Resource Strain: High Risk (06/20/2020)    Received from [facility]    Overall Financial Resource Strain (CARDIA)     Difficulty of Paying Living Expenses: Hard   Health Literacy: Not on file   Internet Connectivity: Not on file       Would you be willing to receive help with any of the needs that you have identified today? No       Velma Ober, PharmD  [facility] Specialty Pharmacist       [1]   Current Outpatient Medications   Medication Sig Dispense Refill    ADALIMUMAB  SYRINGE CITRATE FREE 40 MG/0.4 ML Inject contents of one syringe (40mg ) every 7 days 4 each 0    adalimumab -bwwd (HADLIMA ,CF,) 40 mg/0.4 mL Syrg Inject the contents of 1 syringe (40 mg total) under the skin every 7 (seven) days 4.8 mL 3    cetirizine (ZYRTEC) 10 MG tablet cetirizine 10 mg tablet   TAKE 1 TABLET BY MOUTH EVERY DAY      cyanocobalamin, vitamin B-12, 1,000 mcg/mL injection Inject into the muscle every thirty (30) days.      empty container Misc Use as directed to dispose of Humira  1 each 3    FLUoxetine  (PROZAC ) 20 MG capsule Take 1 capsule (20 mg total) by mouth.      fluticasone propionate (FLONASE) 50 mcg/actuation nasal spray 2 sprays into each nostril daily as needed.      ketoconazole  (NIZORAL ) 2 % shampoo Use up to daily as needed 120 mL 11    levonorgestrel-ethinyl estradiol (AVIANE,ALESSE,LESSINA) 0.1-20 mg-mcg per tablet Take 1 tablet by mouth daily.      methazolAMIDE (NEPTAZANE) 25 MG tablet Take 1 tablet (25 mg total) by mouth Three (3) times a day.      methotrexate  sodium (METHOTREXATE , CONTAINS PRESERVATIVES,) 25 mg/mL injection solution Inject 0.6 mL under the skin once a week. **After first puncture, store vial in refrigerator and use within 30 days** 2 mL 2    olopatadine 0.6 % Spry olopatadine 0.6 % nasal spray   SPRAY 2 SPRAYS INTO EACH NOSTRIL TWICE A DAY      ondansetron (ZOFRAN) 4 MG tablet Take 1 tablet (4 mg total) by mouth every eight (8) hours as needed.  0    rimegepant (NURTEC ODT ) 75 mg TbDL Take 1 tablet by mouth.      spironolactone  (ALDACTONE ) 100 MG tablet Take 1 tablet (100 mg total) by mouth daily. 90 tablet 3     No current facility-administered medications for this visit.   [2]   Allergies  Allergen Reactions    Cefprozil Hives    Sulfamethoxazole-Trimethoprim Hives    Clindamycin Rash Lorazepam Other (See Comments)     Other reaction(s): Other (See Comments)  Other Reaction: INCREASES AGITATION  Other reaction(s): Other (See Comments)  Other Reaction: INCREASES AGITATION  Other Reaction: INCREASES AGITATION  Other Reaction: INCREASES AGITATION      Sulfa (Sulfonamide Antibiotics) Rash   [3]   Patient Active Problem List  Diagnosis    Allergic rhinitis due to pollen  Chronic maxillary sinusitis    Chronic sinusitis    Hip pain, bilateral    Infective otitis externa    Knee pain, bilateral    Migraine without status migrainosus, not intractable    Nasal obstruction    S/P nasal septoplasty    S/P tonsillectomy    Tonsillitis    Tonsillitis, chronic    Post concussive syndrome    Cocaine use, unspecified, uncomplicated    Alcohol use    Benzodiazepine misuse    Emotional lability    Head injury    Major depressive disorder

## 2024-03-24 MED FILL — HADLIMA(CF) 40 MG/0.4 ML SUBCUTANEOUS SYRINGE: SUBCUTANEOUS | 28 days supply | Qty: 1.6 | Fill #0

## 2024-04-20 NOTE — Unmapped (Signed)
 [facility] Refill Coordination Note    Specialty Medication(s) to be Shipped:   Inflammatory Disorders: Hadlima     Other medication(s) to be shipped: No additional medications requested for fill at this time     Teresa Butler, DOB: 05-Jan-1991  Phone: [phone] (home)       All above HIPAA information was verified with patient.     Was a Nurse, learning disability used for this call? No    Completed refill call assessment today to schedule patient's medication shipment from the [facility]  [phone]).  All relevant notes have been reviewed.     Specialty medication(s) and dose(s) confirmed: Regimen is correct and unchanged.   Changes to medications: [name] reports no changes at this time.  Changes to insurance: No  New side effects reported not previously addressed with a pharmacist or physician: None reported  Questions for the pharmacist: No    Confirmed patient received a Conservation officer, historic buildings and a Surveyor, mining with first shipment. The patient will receive a drug information handout for each medication shipped and additional FDA Medication Guides as required.       DISEASE/MEDICATION-SPECIFIC INFORMATION        For patients on injectable medications: Patient currently has 0 doses left.  Next injection is scheduled for 04/22/24.    SPECIALTY MEDICATION ADHERENCE     Medication Adherence    Patient reported X missed doses in the last month: 0  Specialty Medication: HADLIMA (CF) 40 mg/0.4 mL Syrg (adalimumab -bwwd)  Patient is on additional specialty medications: No  Informant: patient              Were doses missed due to medication being on hold? No    Hadlima  CF syr 40 mg/0.57ml: 0 doses of medicine on hand       REFERRAL TO PHARMACIST     Referral to the pharmacist: Not needed      [facility]     Shipping address confirmed in Epic.     Cost and Payment: Patient has a $0 copay, payment information is not required.    Delivery Scheduled: Yes, Expected medication delivery date: 04/22/24.     Medication will be delivered via Same Day Courier to the temporary address in Epic WAM.    Teresa Butler, PharmD   [facility]  Specialty Pharmacist

## 2024-04-22 MED FILL — HADLIMA(CF) 40 MG/0.4 ML SUBCUTANEOUS SYRINGE: SUBCUTANEOUS | 28 days supply | Qty: 1.6 | Fill #1

## 2024-05-07 NOTE — Unmapped (Signed)
 [facility] Refill Coordination Note    Specialty Medication(s) to be Shipped:   Inflammatory Disorders: Hadlima     Other medication(s) to be shipped: No additional medications requested for fill at this time     Arleen Bar, DOB: [birth year]  Phone: [phone] (home)       All above HIPAA information was verified with patient.     Was a Nurse, learning disability used for this call? No    Completed refill call assessment today to schedule patient's medication shipment from the [facility]  [phone]).  All relevant notes have been reviewed.     Specialty medication(s) and dose(s) confirmed: Regimen is correct and unchanged.   Changes to medications: Iyonna reports no changes at this time.  Changes to insurance: No  New side effects reported not previously addressed with a pharmacist or physician: None reported  Questions for the pharmacist: No    Confirmed patient received a Conservation officer, historic buildings and a Surveyor, mining with first shipment. The patient will receive a drug information handout for each medication shipped and additional FDA Medication Guides as required.       DISEASE/MEDICATION-SPECIFIC INFORMATION        For patients on injectable medications: Patient currently has 1 doses left.  Next injection is scheduled for 05/13/2024.    SPECIALTY MEDICATION ADHERENCE     Medication Adherence    Patient reported X missed doses in the last month: 0  Specialty Medication: HADLIMA (CF) 40 mg/0.4 mL Syrg (adalimumab -bwwd)  Patient is on additional specialty medications: No              Were doses missed due to medication being on hold? No    HADLIMA (CF) 40 mg/0.4 mL Syrg (adalimumab -bwwd) 1 doses of medicine on hand     REFERRAL TO PHARMACIST     Referral to the pharmacist: Not needed      [facility]     Shipping address confirmed in Epic.     Cost and Payment: Patient has a $0 copay, payment information is not required.    Delivery Scheduled: Yes, Expected medication delivery date: 05/12/2024.     Medication will be delivered via Same Day Courier to the prescription address in Epic WAM.    Nelida Winfred [facility]  Specialty Technician

## 2024-05-12 MED FILL — HADLIMA(CF) 40 MG/0.4 ML SUBCUTANEOUS SYRINGE: SUBCUTANEOUS | 28 days supply | Qty: 1.6 | Fill #2

## 2024-05-18 DIAGNOSIS — N760 Acute vaginitis: Principal | ICD-10-CM

## 2024-05-18 DIAGNOSIS — R070 Pain in throat: Principal | ICD-10-CM

## 2024-05-18 MED ORDER — FLUCONAZOLE 150 MG TABLET
ORAL_TABLET | ORAL | 0 refills | 0.00000 days | Status: CP
Start: 2024-05-18 — End: ?

## 2024-05-18 MED ORDER — ONDANSETRON 4 MG DISINTEGRATING TABLET
ORAL_TABLET | Freq: Three times a day (TID) | 0 refills | 1.00000 days | Status: CP | PRN
Start: 2024-05-18 — End: 2024-05-21

## 2024-05-18 NOTE — Unmapped (Signed)
 [facility]          CLINICAL IMPRESSION  Final diagnoses:   Throat pain (Primary)   Acute vaginitis       ASSESSMENT/PLAN & UC COURSE  Assessment & Plan  Throat pain    Orders:    POCT SARS-COV-2  acute.  Likely viral.  Patient stable at the time of discharge.  No adventitious lung sounds on auscultation.  COVID  testing negative. Advised to drink plenty of fluids.  May take Tylenol or ibuprofen as instructed on the packaging as needed for fevers or bodyaches.  Zofran  sent for nausea. For nasal congestion do saline nasal spray or Flonase as well as Nettie pot.  For cough you may take a decongestant such as dextromethorphan or guaifenesin as directed on packaging.  Monitor your blood pressure while on these medications. Follow up with PCP    Acute vaginitis    Orders:    POCT Rapid BV    And irritation of bilateral labia.  BV negative.  Declined STI testing, recent STI testing negative.  Take Diflucan  1 tablet if symptoms persist take second in 72 hours.  May do topical miconazole cream.  Follow-up with GYN if symptoms persist       Pertinent labs & imaging results which were available during my care of the patient were reviewed by me (see chart for details).  Discussed the new prescription(s) noted above, including potential side effects, drug interactions, instructions for taking the medication, and the consequences of not taking it.  Patient verbalized an understanding of these instructions and has no further questions.     Time seen: May 18, 2024 3:28 PM    I have reviewed the triage vital signs and the nursing notes.    PROCEDURE  Procedures    CHIEF COMPLAINT    Chief Complaint   Patient presents with    Vaginal Itching     Pt c/o vaginal itching without discharge that started last week but became intense yesterday. Concerned for yeast infection. Pt just had STD testing. Pt also reports mild sore throat, nausea that started today.        HPI   Teresa Butler is a 33 y.o. female presents for several days of vaginal itching and irritation.  Is concerned for yeast infection.  States that she had STD testing a month ago, denies new partners.  Denies vaginal discharge, vaginal lesions, abdominal pain, fevers or chills  Patient also reports mild nausea and throat discomfort starting today.  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  Denies difficulty swallowing or controlling secretions, headaches, fevers or chills    Vaginal Itching        ROS  10-point ROS is negative except as marked above and in HPI.    PHYSICAL EXAM      VITAL SIGNS:    Vitals:    05/18/24 1513   BP: 121/82   Pulse: 75   Resp: 16   Temp: 36.9 ??C (98.4 ??F)   SpO2: 99%       Physical Exam  Exam conducted with a chaperone present Carlynn, Charity fundraiser).   Constitutional:       Appearance: Normal appearance. She is normal weight.   HENT:      Head: Normocephalic and atraumatic.      Nose: Nose normal.      Mouth/Throat:      Mouth: Mucous membranes are moist.  Pharynx: Oropharynx is clear. No oropharyngeal exudate or posterior oropharyngeal erythema.   Eyes:      Conjunctiva/sclera: Conjunctivae normal.      Pupils: Pupils are equal, round, and reactive to light.   Cardiovascular:      Rate and Rhythm: Normal rate and regular rhythm.      Pulses: Normal pulses.      Heart sounds: Normal heart sounds.   Pulmonary:      Effort: Pulmonary effort is normal.      Breath sounds: Normal breath sounds.   Abdominal:      General: Abdomen is flat. Bowel sounds are normal.      Palpations: Abdomen is soft.   Genitourinary:     Exam position: Lithotomy position.      Vagina: Tenderness present. No vaginal discharge or bleeding.      Cervix: Normal.      Uterus: Normal.       Comments: Erythema bilateral labia  Skin:     General: Skin is warm and dry.   Neurological:      Mental Status: She is alert and oriented to person, place, and time.   Psychiatric:         Mood and Affect: Mood normal.         Behavior: Behavior normal.             Any obtained pertinent Labs & Imaging studies were reviewed. (See chart for details)    LABS  Results for orders placed or performed in visit on 05/18/24   POCT Rapid BV   Result Value Ref Range    POC Rapid BV Negative Negative    POC Rapid BV Internal QC QC Acceptable     POC Rapid BV Lot [id number]     POC Rapid BV Expiration Date 10/2024    POCT SARS-COV-2   Result Value Ref Range    POCT SARS-CoV-2 NAA Negative Negative       EKG    No results found for this visit on 05/18/24 (from the past 4464 hours).    RADIOLOGY    No results found.    MEDICATIONS ADMINISTERED DURING THE VISIT      PRESCRIPTIONS THIS VISIT  New Prescriptions    FLUCONAZOLE  (DIFLUCAN ) 150 MG TABLET    Take one tablet on day one of symptoms. If symptoms persist after three days take a second tablet    ONDANSETRON  (ZOFRAN -ODT) 4 MG DISINTEGRATING TABLET    Dissolve 1 tablet (4 mg total) in the mouth every eight (8) hours as needed for nausea for up to 3 days.       Follow-up with PCP     I reviewed indications for follow-up and ED precautions including worsening shortness of breath, prolonged fevers, or any atypical symptoms.     The patient was given an opportunity to ask questions about the treatment plan, and all questions were addressed to the best of my ability with the information available.  Patient stable at the time of discharge          This note was narrated with the use of dragon software. It was proof read by me, but may contain transcription errors. This does not reflect the level of care that was administered by our team today.     Sione Baumgarten B Demico Ploch, PA    There are no Patient Instructions on file for this visit.

## 2024-05-30 DIAGNOSIS — H532 Diplopia: Principal | ICD-10-CM

## 2024-05-30 NOTE — Unmapped (Signed)
 VISIT SUMMARY:  Today, you came in because you woke up with blurry vision in your right eye, which later turned into double vision. You also mentioned having a severe headache yesterday that has mostly resolved. You have a history of migraines, cerebellar sagging, and intracranial hypertension, and you recently had symptoms of a viral illness.    YOUR PLAN:  -MONOCULAR DIPLOPIA, RIGHT EYE: Monocular diplopia means seeing double in one eye. This can be caused by various issues, including nerve problems or muscle weakness. Your neurological exam was normal, which is reassuring. We will monitor your symptoms, and if they get worse, you should go to the ER. If the double vision continues, you should see an eye specialist for further evaluation.    -MIGRAINE: A migraine is a type of headache that can cause severe pain and other symptoms. Your recent severe migraine has resolved, but please continue to monitor your symptoms and let us  know if they return or worsen.    INSTRUCTIONS:  If your double vision gets worse, please visit the ER. If it continues, schedule an appointment with an ophthalmologist for further evaluation.

## 2024-05-30 NOTE — Unmapped (Signed)
 [facility] ENCOUNTER        CLINICAL IMPRESSION  Final diagnoses:   Monocular diplopia of right eye (Primary)       ASSESSMENT/PLAN & URGENT CARE COURSE  Assessment & Plan  Monocular diplopia of right eye  Monocular diplopia, right eye  Acute monocular diplopia in the right eye with intact neurological exam. Differential includes cranial nerve palsy, myasthenia gravis, or other neurological syndromes. Possibly brought on by recent viral illness, pt with mild cold earlier in the week.   - Monitor symptoms, advise ER visit if worsens. 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 She plans to return to work after this as she feels overall at her baseline, except for the vision.   - Consult ophthalmologist if symptoms persist for further evaluation.        Pertinent labs & imaging results which were available during my care of the patient were reviewed by me (see chart for details).    Time seen: May 30, 2024 2:04 PM    I have reviewed the triage vital signs and the nursing notes.    CHIEF COMPLAINT    Chief Complaint   Patient presents with    Double Vision     Pt. Stated that she developed double vision in the R eye.  Headache began 3 days ago.         HPI   History of Present Illness  Teresa Butler is a 33 year old female with a history of migraines, cerebellar sagging, and intracranial hypertension who presents with new onset monocular diplopia in the right eye.    She woke up this morning with blurry vision, initially attributing it to tiredness. On her way to work, she noticed significant double vision in her right eye, described as 'not fully split, but like splitting.' Covering her right eye resolves the issue. She denies nausea, vomiting, or fever.    She experienced a severe headache yesterday, which resolved by this morning, leaving only a mild residual headache rated 1-2 out of 10. Earlier this week, she experienced symptoms of a viral illness, including feeling 'spacey,' dizziness, and a severe headache, but no vision changes at that time.    She has a history of POTS, which has improved over time but can still cause symptoms when she is sick or dehydrated. She has not had any recent changes in her medications, although she switched from [facility] to [facility] several months ago.    No ear pain or fullness, although she frequently experiences fullness in her right ear. No sensation changes, floaters, or spots in her vision, but there is a slight sensation of a bright light causing irritation in her right eye. She also has a history of one pupil being larger than the other, which is not an acute change and has been present since a severe concussion at age 35.        ROS  10-point ROS is negative except as marked above and in HPI.    PHYSICAL EXAM      VITAL SIGNS:    Vitals:    05/30/24 1331   BP: 130/88   Pulse: 79   Resp: 18   Temp: 36.6 ??C (97.8 ??F)   TempSrc: Tympanic   SpO2: 98%   Weight: 65.3 kg (144 lb)   Height: 157.5 cm (5' 2)          Physical Exam  Vitals and nursing note reviewed.   Constitutional:  General: She is not in acute distress.     Appearance: Normal appearance. She is not ill-appearing or toxic-appearing.   HENT:      Head: Normocephalic and atraumatic.      Jaw: There is normal jaw occlusion.      Right Ear: Tympanic membrane and ear canal normal. Tympanic membrane is not erythematous.      Left Ear: Tympanic membrane and ear canal normal. Tympanic membrane is not erythematous.      Nose: Congestion present.      Mouth/Throat:      Mouth: Mucous membranes are moist. No oral lesions.      Pharynx: Uvula midline. Posterior oropharyngeal erythema present. No oropharyngeal exudate or uvula swelling.      Tonsils: No tonsillar exudate or tonsillar abscesses.   Eyes:      General: Gaze aligned appropriately. No allergic shiner or scleral icterus.        Right eye: No discharge or hordeolum.         Left eye: No discharge or hordeolum.      Extraocular Movements: Extraocular movements intact.      Right eye: No nystagmus.      Left eye: No nystagmus.      Conjunctiva/sclera: Conjunctivae normal.      Pupils: Pupils are equal, round, and reactive to light.   Cardiovascular:      Rate and Rhythm: Normal rate and regular rhythm.      Heart sounds: S1 normal and S2 normal. No murmur heard.  Pulmonary:      Effort: Pulmonary effort is normal. No respiratory distress.      Breath sounds: Normal breath sounds. No decreased air movement. No decreased breath sounds, wheezing or rhonchi.   Musculoskeletal:      Cervical back: Neck supple. No rigidity. No pain with movement.   Lymphadenopathy:      Cervical: No cervical adenopathy.      Right cervical: No superficial cervical adenopathy.     Left cervical: No superficial cervical adenopathy.   Skin:     General: Skin is warm and dry.      Findings: No rash.   Neurological:      General: No focal deficit present.      Mental Status: She is oriented to person, place, and time.      Cranial Nerves: Cranial nerves 2-12 are intact. No dysarthria or facial asymmetry.      Sensory: Sensation is intact.      Motor: Motor function is intact.      Coordination: Coordination is intact. Romberg sign negative. Finger-Nose-Finger Test normal.      Gait: Gait is intact.   Psychiatric:         Speech: Speech normal.         Behavior: Behavior normal. Behavior is cooperative.             Any obtained pertinent Labs & Imaging studies were reviewed. (See chart for details)    LABS  No results found for this visit on 05/30/24.      EKG    No results found for this visit on 05/30/24 (from the past 4464 hours).    RADIOLOGY    No results found.    MEDICATIONS ADMINISTERED DURING THE VISIT      PROCEDURE  Procedures    PRESCRIPTIONS THIS VISIT  New Prescriptions    No medications on file       Follow-up as Needed , Follow-up with  PCP, and Follow-up with Specialist          PAST MEDICAL HISTORY    Past Medical History[1]    SURGICAL HISTORY    Past Surgical History[2]    CURRENT MEDICATIONS Current Outpatient Medications:     ADALIMUMAB  SYRINGE CITRATE FREE 40 MG/0.4 ML, Inject contents of one syringe (40mg ) every 7 days, Disp: 4 each, Rfl: 0    adalimumab -bwwd (HADLIMA ,CF,) 40 mg/0.4 mL Syrg, Inject the contents of 1 syringe (40 mg total) under the skin every 7 (seven) days, Disp: 4.8 mL, Rfl: 3    cetirizine (ZYRTEC) 10 MG tablet, cetirizine 10 mg tablet  TAKE 1 TABLET BY MOUTH EVERY DAY, Disp: , Rfl:     cyanocobalamin, vitamin B-12, 1,000 mcg/mL injection, Inject into the muscle every thirty (30) days., Disp: , Rfl:     empty container Misc, Use as directed to dispose of Humira , Disp: 1 each, Rfl: 3    fluconazole  (DIFLUCAN ) 150 MG tablet, Take one tablet on day one of symptoms. If symptoms persist after three days take a second tablet, Disp: 2 tablet, Rfl: 0    FLUoxetine  (PROZAC ) 20 MG capsule, Take 1 capsule (20 mg total) by mouth., Disp: , Rfl:     fluticasone propionate (FLONASE) 50 mcg/actuation nasal spray, 2 sprays into each nostril daily as needed., Disp: , Rfl:     ketoconazole  (NIZORAL ) 2 % shampoo, Use up to daily as needed, Disp: 120 mL, Rfl: 11    levonorgestrel-ethinyl estradiol (AVIANE,ALESSE,LESSINA) 0.1-20 mg-mcg per tablet, Take 1 tablet by mouth daily., Disp: , Rfl:     methazolAMIDE (NEPTAZANE) 25 MG tablet, Take 1 tablet (25 mg total) by mouth Three (3) times a day., Disp: , Rfl:     methotrexate  sodium (METHOTREXATE , CONTAINS PRESERVATIVES,) 25 mg/mL injection solution, Inject 0.6 mL under the skin once a week. **After first puncture, store vial in refrigerator and use within 30 days** (Patient not taking: Reported on 05/18/2024), Disp: 2 mL, Rfl: 2    olopatadine 0.6 % Spry, olopatadine 0.6 % nasal spray  SPRAY 2 SPRAYS INTO EACH NOSTRIL TWICE A DAY, Disp: , Rfl:     ondansetron  (ZOFRAN ) 4 MG tablet, Take 1 tablet (4 mg total) by mouth every eight (8) hours as needed., Disp: , Rfl: 0    rimegepant (NURTEC ODT ) 75 mg TbDL, Take 1 tablet (75 mg total) by mouth., Disp: , Rfl:     spironolactone  (ALDACTONE ) 100 MG tablet, Take 1 tablet (100 mg total) by mouth daily., Disp: 90 tablet, Rfl: 3    ALLERGIES    Allergies[3]    FAMILY HISTORY    Family History[4]    SOCIAL HISTORY  Social History[5]    I reviewed indications for follow-up and ED precautions including worsening shortness of breath, prolonged fevers, or any atypical symptoms.     The patient was given an opportunity to ask questions about the treatment plan, and all questions were addressed to the best of my ability with the information available.  Patient stable at the time of discharge          This note was narrated with the use of dragon software. It was proof read by me, but may contain transcription errors. This does not reflect the level of care that was administered by our team today.     Mardy LELON Dustman, PA         [1]   Past Medical History:  Diagnosis Date    Acne  Allergic     Eczema     Migraine headache     POTS (postural orthostatic tachycardia syndrome) 06/29/2020    Strep throat     Yeast infection    [2]   Past Surgical History:  Procedure Laterality Date    MANDIBLE FRACTURE SURGERY      NASAL SEPTUM SURGERY      TONSILLECTOMY     [3]   Allergies  Allergen Reactions    Cefprozil Hives    Sulfamethoxazole-Trimethoprim Hives    Clindamycin Rash    Lorazepam Other (See Comments)     Other reaction(s): Other (See Comments)  Other Reaction: INCREASES AGITATION  Other reaction(s): Other (See Comments)  Other Reaction: INCREASES AGITATION  Other Reaction: INCREASES AGITATION  Other Reaction: INCREASES AGITATION      Sulfa (Sulfonamide Antibiotics) Rash   [4]   Family History  Problem Relation Age of Onset    No Known Problems Mother     Multiple sclerosis Father     Cancer Maternal Uncle         Colon    Stroke Maternal Grandmother     Melanoma Neg Hx     Basal cell carcinoma Neg Hx     Squamous cell carcinoma Neg Hx    [5]   Social History  Socioeconomic History    Marital status: Single     Spouse name: None Number of children: None    Years of education: None    Highest education level: None   Tobacco Use    Smoking status: Never    Smokeless tobacco: Never   Substance and Sexual Activity    Alcohol use: Yes     Alcohol/week: 0.0 standard drinks of alcohol     Comment: occ    Drug use: No    Sexual activity: Yes     Partners: Male     Birth control/protection: OCP     Comment: same partner x 3 years    Other Topics Concern    Do you use sunscreen? Yes    Tanning bed use? No    Are you easily burned? Yes    Excessive sun exposure? No    Blistering sunburns? No     Social Drivers of Psychologist, prison and probation services Strain: Patient Declined (09/16/2023)    Received from [facility]    Overall Financial Resource Strain (CARDIA)     Difficulty of Paying Living Expenses: Patient declined   Food Insecurity: Patient Declined (09/16/2023)    Received from [facility]    Hunger Vital Sign     Within the past 12 months, you worried that your food would run out before you got the money to buy more.: Patient declined     Within the past 12 months, the food you bought just didn't last and you didn't have money to get more.: Patient declined   Transportation Needs: Patient Declined (09/16/2023)    Received from [facility] - Transportation     In the past 12 months, has lack of transportation kept you from medical appointments or from getting medications?: Patient declined     Lack of Transportation (Non-Medical): Patient declined   Physical Activity: Insufficiently Active (06/20/2020)    Received from [facility]    Exercise Vital Sign     On average, how many days per week do you engage in moderate to strenuous exercise (like a  brisk walk)?: 4 days     On average, how many minutes do you engage in exercise at this level?: 30 min   Stress: No Stress Concern Present (06/20/2020)    Received from [facility] - Occupational Stress Questionnaire     Feeling of Stress : Only a little   Housing: Unknown (05/26/2024)    Received from [facility]    Housing Stability Vital Sign     In the last 12 months, was there a time when you were not able to pay the mortgage or rent on time?: Patient declined     At any time in the past 12 months, were you homeless or living in a shelter (including now)?: No

## 2024-06-03 DIAGNOSIS — B379 Candidiasis, unspecified: Principal | ICD-10-CM

## 2024-06-03 MED ORDER — FLUCONAZOLE 150 MG TABLET
ORAL_TABLET | Freq: Every day | ORAL | 0 refills | 2.00000 days | Status: CP
Start: 2024-06-03 — End: 2024-06-04

## 2024-06-03 NOTE — Unmapped (Signed)
 [facility] Encounter  This medical encounter was conducted virtually using [email or web address]  TeleHealth protocols.    Patient ID: Teresa Butler is a 33 y.o. female who presents by video interaction for evaluation.    I have identified myself to the patient and conveyed my credentials to [facility].   Patient has signed informed consent on file in medical record.    Present on Video Call: Is there someone else in the room? No.  Assessment/Plan:    Yeast infection  -     fluconazole ; Take 1 tablet (150 mg total) by mouth daily. Take 1 tablet, may repeat dose in 3 days if needed.     Treat as above. If no improvement or recurrence, seek in person exam.         Follow-up as Needed  and Follow-up with PCP             Subjective:   HPI  [name] is 33 y.o. and presents today in the [facility] with yeast infection.    The PCP for this patient is Emerick Ike Norris, [facility].   Completed diflucan  a couple weeks ago for yeast symptoms. Symptoms resolved but then a few days had vaginal itching and irritation. Small amount discharge.   No recent antibiotics.   No concerns for STI, recently tested. Negative for BV.   Not pregnant.   Partner was treated and symptoms resolved.       ROS  All other ROS negative except as noted in HPI.  I have reviewed the problem list, past medical history, past family history, medications, and allergies and have updated/reconciled them if needed.          Objective:   As part of this Video Visit, no in-person exam was conducted.  Video interaction permitted the following observations.    General: No acute distress.   HEENT:  EOMI.  No photophobia. No conjunctival injection.    RESP: Relaxed respiratory effort. No conversational dyspnea.   SKIN: No rashes noted.  NEURO: No tremors observed.  Normal speech.   PSYCH: Alert and oriented.  Speech fluent and sensible.  Calm affect.           The patient reports they are physically located in [place]  and is currently: at home. I conducted a audio/video visit. I spent  55m 40s on the video call with the patient. I spent an additional 3 minutes on pre- and post-visit activities on the date of service .

## 2024-06-15 NOTE — Unmapped (Signed)
 [facility] and Home Delivery Pharmacy Refill Coordination Note    Specialty Medication(s) to be Shipped:   Inflammatory Disorders: Hadlima     Other medication(s) to be shipped: No additional medications requested for fill at this time    Specialty Medications not needed at this time: N/A     Gracynn Rajewski, DOB: [birth year]  Phone: [phone] (home)       All above HIPAA information was verified with patient.     Was a Nurse, learning disability used for this call? No    Completed refill call assessment today to schedule patient's medication shipment from the [facility] and Home Delivery Pharmacy  [phone]).  All relevant notes have been reviewed.     Specialty medication(s) and dose(s) confirmed: Regimen is correct and unchanged.   Changes to medications: Evarose reports no changes at this time.  Changes to insurance: No  New side effects reported not previously addressed with a pharmacist or physician: None reported  Questions for the pharmacist: No    Confirmed patient received a Conservation officer, historic buildings and a Surveyor, mining with first shipment. The patient will receive a drug information handout for each medication shipped and additional FDA Medication Guides as required.       DISEASE/MEDICATION-SPECIFIC INFORMATION        For patients on injectable medications: Next injection is scheduled for 06/17/24.    SPECIALTY MEDICATION ADHERENCE     Medication Adherence    Patient reported X missed doses in the last month: 0  Specialty Medication: adalimumab -bwwd (HADLIMA ,CF,) 40 mg/0.4 mL Syrg  Patient is on additional specialty medications: No  Patient is on more than two specialty medications: No  Informant: patient              Were doses missed due to medication being on hold? No    Hadlima  40 Mg/0.6ml: 0 doses of medicine on hand         REFERRAL TO PHARMACIST     Referral to the pharmacist: Not needed      [facility]     Shipping address confirmed in Epic.     Cost and Payment: Patient has a $0 copay, payment information is not required.    Delivery Scheduled: Yes, Expected medication delivery date: 06/17/24.     Medication will be delivered via Same Day Courier to the prescription address in Epic WAM.    Burnard DELENA Neighbors, PharmD   [facility] Delivery Pharm[facility]  Specialty Pharmacist

## 2024-06-17 MED FILL — HADLIMA(CF) 40 MG/0.4 ML SUBCUTANEOUS SYRINGE: SUBCUTANEOUS | 28 days supply | Qty: 1.6 | Fill #3

## 2024-07-09 NOTE — Unmapped (Signed)
 [facility] Specialty and Home Delivery Pharmacy Refill Coordination Note    Specialty Medication(s) to be Shipped:   Inflammatory Disorders: Hadlima     Other medication(s) to be shipped: No additional medications requested for fill at this time    Specialty Medications not needed at this time: N/A     Teresa Butler, DOB: 08/28/1991  Phone: There are no phone numbers on file.      All above HIPAA information was verified with patient.     Was a Nurse, learning disability used for this call? No    Completed refill call assessment today to schedule patient's medication shipment from the [facility] and Home Delivery Pharmacy  [phone]).  All relevant notes have been reviewed.     Specialty medication(s) and dose(s) confirmed: Regimen is correct and unchanged.   Changes to medications: Teresa Butler reports no changes at this time.  Changes to insurance: No  New side effects reported not previously addressed with a pharmacist or physician: None reported  Questions for the pharmacist: No    Confirmed patient received a Conservation officer, historic buildings and a Surveyor, mining with first shipment. The patient will receive a drug information handout for each medication shipped and additional FDA Medication Guides as required.       DISEASE/MEDICATION-SPECIFIC INFORMATION        For patients on injectable medications: Next injection is scheduled for 07/15/2024.    SPECIALTY MEDICATION ADHERENCE     Medication Adherence    Patient reported X missed doses in the last month: 0  Specialty Medication: HADLIMA (CF) 40 mg/0.4 mL Syrg (adalimumab -bwwd)  Patient is on additional specialty medications: No              Were doses missed due to medication being on hold? No    HADLIMA (CF) 40 mg/0.4 mL Syrg (adalimumab -bwwd)  1 doses of medicine on hand       REFERRAL TO PHARMACIST     Referral to the pharmacist: Not needed      [facility]     Shipping address confirmed in Epic.     Cost and Payment: Patient has a $0 copay, payment information is not required.    Delivery Scheduled: Yes, Expected medication delivery date: 07/14/2024.     Medication will be delivered via Same Day Courier to the prescription address in Epic WAM.    [facility]  Specialty Technician

## 2024-07-14 MED FILL — HADLIMA(CF) 40 MG/0.4 ML SUBCUTANEOUS SYRINGE: SUBCUTANEOUS | 28 days supply | Qty: 1.6 | Fill #4

## 2024-08-10 NOTE — Progress Notes (Signed)
 The [facility] has made a second and final attempt to reach this patient to refill the following medication:Hadlima .      We have left voicemails on the following phone numbers: [phone], have sent a MyChart message, and have sent a text message to the following phone numbers: [phone].    Dates contacted: 11/05,11/10  Last scheduled delivery: 10/14    The patient may be at risk of non-compliance with this medication. The patient should call the [facility] at [phone]  Option 4, then Option 2: Dermatology, Gastroenterology, Rheumatology to refill medication.    Kaitlynn Tramontana LITTIE Hope   [facility] and [facility]

## 2024-08-14 NOTE — Progress Notes (Signed)
 [facility] Specialty and Home Delivery Pharmacy Refill Coordination Note    Specialty Medication(s) to be Shipped:   Inflammatory Disorders: Hadlima     Other medication(s) to be shipped: No additional medications requested for fill at this time    Specialty Medications not needed at this time: N/A     Teresa Butler, DOB: June 25, 1991  Phone: There are no phone numbers on file.      All above HIPAA information was verified with patient.     Was a nurse, learning disability used for this call? No    Completed refill call assessment today to schedule patient's medication shipment from the [facility] and Home Delivery Pharmacy  [phone]).  All relevant notes have been reviewed.     Specialty medication(s) and dose(s) confirmed: Regimen is correct and unchanged.   Changes to medications: Teresa Butler reports no changes at this time.  Changes to insurance: No  New side effects reported not previously addressed with a pharmacist or physician: None reported  Questions for the pharmacist: No    Confirmed patient received a Conservation Officer, Historic Buildings and a Surveyor, Mining with first shipment. The patient will receive a drug information handout for each medication shipped and additional FDA Medication Guides as required.       DISEASE/MEDICATION-SPECIFIC INFORMATION        For patients on injectable medications: Next injection is scheduled for 08/19/24.    SPECIALTY MEDICATION ADHERENCE     Medication Adherence    Patient reported X missed doses in the last month: 0  Specialty Medication: adalimumab -bwwd: HADLIMA (CF) 40 mg/0.4 mL Syrg  Patient is on additional specialty medications: No  Patient is on more than two specialty medications: No              Were doses missed due to medication being on hold? No      HADLIMA (CF) 40 mg/0.4 mL Syrg (adalimumab -bwwd): 0 doses of medicine on hand       REFERRAL TO PHARMACIST     Referral to the pharmacist: Not needed      [facility]     Shipping address confirmed in Epic.     Cost and Payment: Patient has a $0 copay, payment information is not required.    Delivery Scheduled: Yes, Expected medication delivery date: 08/18/24.     Medication will be delivered via Same Day Courier to the prescription address in Epic WAM.    [facility] Delivery Pharm[facility]  Specialty Technician

## 2024-08-15 DIAGNOSIS — G43009 Migraine without aura, not intractable, without status migrainosus: Principal | ICD-10-CM

## 2024-08-15 MED ORDER — ONDANSETRON 4 MG DISINTEGRATING TABLET
ORAL_TABLET | Freq: Three times a day (TID) | 0 refills | 5.00000 days | Status: CP | PRN
Start: 2024-08-15 — End: 2024-08-22

## 2024-08-15 MED ADMIN — ondansetron (ZOFRAN-ODT) disintegrating tablet 4 mg: 4 mg | ORAL | @ 18:00:00 | Stop: 2024-08-15

## 2024-08-15 MED ADMIN — ketorolac (TORADOL) injection 15 mg: 15 mg | INTRAMUSCULAR | @ 18:00:00 | Stop: 2024-08-15

## 2024-08-15 NOTE — Assessment & Plan Note (Addendum)
 You were seen today for a migraine headache. Ketorolac was given in the clinic today and ondansetron  was given. A prescription for ondansetron  was sent to your pharmacy to take every 8 hours as  needed for nausea or vomiting. For any progressively worsening or atypical symptoms please be evaluated at the emergency department.  Discussed the with timing of her last ibuprofen dose and based on previous response she has had to ketorolac I  I think it is reasonable  to do a 15 mg ketorolac injection here in the clinic today, she recalls   2 months ago having same  dose  and finding benefit from this.   Orders:    ondansetron  (ZOFRAN -ODT) 4 MG disintegrating tablet; Dissolve 1 tablet (4 mg total) in the mouth every eight (8) hours as needed for nausea for up to 7 days.    ketorolac (TORADOL) injection 15 mg    ondansetron  (ZOFRAN -ODT) disintegrating tablet 4 mg

## 2024-08-15 NOTE — Patient Instructions (Addendum)
 You were seen today for a migraine headache. Ketorolac was given in the clinic today and ondansetron  was given. A prescription for ondansetron  was sent to your pharmacy to take every 8 hours as  needed for nausea or vomiting. For any progressively worsening or atypical symptoms please be evaluated at the emergency department. Do not take other forms of  NSAIDs for at least 6 hours after administration of this medication.

## 2024-08-15 NOTE — Progress Notes (Signed)
 [facility]        CLINICAL IMPRESSION  Final diagnoses:   Migraine without aura and without status migrainosus, not intractable (Primary)       ASSESSMENT/PLAN & ED COURSE  Assessment & Plan  Migraine without aura and without status migrainosus, not intractable  You were seen today for a migraine headache. Ketorolac was given in the clinic today and ondansetron  was given. A prescription for ondansetron  was sent to your pharmacy to take every 8 hours as  needed for nausea or vomiting. For any progressively worsening or atypical symptoms please be evaluated at the emergency department.  Discussed the with timing of her last ibuprofen dose and based on previous response she has had to ketorolac I  I think it is reasonable  to do a 15 mg ketorolac injection here in the clinic today, she recalls   2 months ago having same  dose  and finding benefit from this.   Orders:    ondansetron  (ZOFRAN -ODT) 4 MG disintegrating tablet; Dissolve 1 tablet (4 mg total) in the mouth every eight (8) hours as needed for nausea for up to 7 days.    ketorolac (TORADOL) injection 15 mg    ondansetron  (ZOFRAN -ODT) disintegrating tablet 4 mg         Pertinent labs & imaging results which were available during my care of the patient were reviewed by me (see chart for details).    Time seen: August 15, 2024 12:25 PM    I have reviewed the triage vital signs and the nursing notes.    CHIEF COMPLAINT    Chief Complaint   Patient presents with    Headache     Headache that started last night.        HPI   History of Present Illness  Teresa Butler is a 33 year old female with migraines who presents with a current migraine episode. Se has  a history of  migraine  headache.     She experiences a migraine characterized by pain at the back of her head and behind her right eye, which worsens with movement or walking. Light sensitivity is present, but there is no vision loss. Earlier in the day, she was nauseous, but this has improved by the time of the visit. Head pain is worsened by exertion.     She took Nurtec at 6 AM and Advil (400 mg) at 8 AM, but neither provided significant relief. She uses Nurtec as needed for migraines and has previously found relief with a Toradol injection, last received two months ago. She also has Zofran  at home for nausea, which she uses as needed.    Her past medical history includes idiopathic intracranial hypertension. She occasionally experiences aura with her migraines, though not during this episode.    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 She previously worked as a LAWYER at [employer] and has a history of a traumatic brain injury, which impacted her career path.      ROS  10-point ROS is negative except as marked above and in HPI.    PHYSICAL EXAM      VITAL SIGNS:    Vitals:    08/15/24 1214   BP: 111/68   Pulse: 81   Resp: 18   Temp: 36.8 ??C (98.3 ??F)   SpO2: 99%       Physical Exam  General: Well developed. Well nourished. In no acute distress.  HEENT:  Normocephalic.  Atraumatic. PERRLA, EOMI.  Heart:  Regular rate and rhythm . No murmurs.  Lungs:  Lungs clear to auscultation bilaterally. No wheezes, rales, or rhonchi.  Abdomen: Soft, normal bowel sounds , no tenderness, no organomegaly, no distension  Extremities:  No edema. Peripheral pulses normal  Musculoskeletal: Normal gait.  Skin:  Warm, dry, intact, no rashes  Neuro:  CN II-XII intact. No neurologic deficits.  Psych:  Affect normal, answers questions appropriately, eye contact good, speech clear and coherent.       Any obtained pertinent Labs & Imaging studies were reviewed. (See chart for details)    LABS  No results found for this visit on 08/15/24.    EKG    No results found for this visit on 08/15/24 (from the past 4464 hours).    RADIOLOGY    No results found.    MEDICATIONS ADMINISTERED DURING THE VISIT      PROCEDURE  Procedures    PRESCRIPTIONS THIS VISIT  New Prescriptions    ONDANSETRON  (ZOFRAN -ODT) 4 MG DISINTEGRATING TABLET    Dissolve 1 tablet (4 mg total) in the mouth every eight (8) hours as needed for nausea for up to 7 days.       Follow-up with PCP     PAST MEDICAL HISTORY    Past Medical History[1]    SURGICAL HISTORY    Past Surgical History[2]    CURRENT MEDICATIONS      Current Outpatient Medications:     ADALIMUMAB  SYRINGE CITRATE FREE 40 MG/0.4 ML, Inject contents of one syringe (40mg ) every 7 days, Disp: 4 each, Rfl: 0    adalimumab -bwwd (HADLIMA ,CF,) 40 mg/0.4 mL Syrg, Inject the contents of 1 syringe (40 mg total) under the skin every 7 (seven) days, Disp: 4.8 mL, Rfl: 3    cetirizine (ZYRTEC) 10 MG tablet, cetirizine 10 mg tablet  TAKE 1 TABLET BY MOUTH EVERY DAY, Disp: , Rfl:     cyanocobalamin, vitamin B-12, 1,000 mcg/mL injection, Inject into the muscle every thirty (30) days., Disp: , Rfl:     empty container Misc, Use as directed to dispose of Humira , Disp: 1 each, Rfl: 3    FLUoxetine  (PROZAC ) 20 MG capsule, Take 1 capsule (20 mg total) by mouth., Disp: , Rfl:     fluticasone propionate (FLONASE) 50 mcg/actuation nasal spray, 2 sprays into each nostril daily as needed., Disp: , Rfl:     ketoconazole  (NIZORAL ) 2 % shampoo, Use up to daily as needed, Disp: 120 mL, Rfl: 11    levonorgestrel-ethinyl estradiol (AVIANE,ALESSE,LESSINA) 0.1-20 mg-mcg per tablet, Take 1 tablet by mouth daily., Disp: , Rfl:     methazolAMIDE (NEPTAZANE) 25 MG tablet, Take 1 tablet (25 mg total) by mouth Three (3) times a day., Disp: , Rfl:     olopatadine 0.6 % Spry, olopatadine 0.6 % nasal spray  SPRAY 2 SPRAYS INTO EACH NOSTRIL TWICE A DAY, Disp: , Rfl:     ondansetron  (ZOFRAN ) 4 MG tablet, Take 1 tablet (4 mg total) by mouth every eight (8) hours as needed., Disp: , Rfl: 0    rimegepant (NURTEC ODT ) 75 mg TbDL, Take 1 tablet (75 mg total) by mouth., Disp: , Rfl:     spironolactone  (ALDACTONE ) 100 MG tablet, Take 1 tablet (100 mg total) by mouth daily., Disp: 90 tablet, Rfl: 3    methotrexate  sodium (METHOTREXATE , CONTAINS PRESERVATIVES,) 25 mg/mL injection solution, Inject 0.6 mL under the  skin once a week. **After first puncture, store vial in refrigerator and use within 30 days** (Patient not taking: Reported on 08/15/2024), Disp: 2 mL, Rfl: 2    ondansetron  (ZOFRAN -ODT) 4 MG disintegrating tablet, Dissolve 1 tablet (4 mg total) in the mouth every eight (8) hours as needed for nausea for up to 7 days., Disp: 15 tablet, Rfl: 0    Current Facility-Administered Medications:     ketorolac (TORADOL) injection 15 mg, 15 mg, Intramuscular, Once, Diezel Mazur Richardson, PA    ondansetron  (ZOFRAN -ODT) disintegrating tablet 4 mg, 4 mg, Oral, Once, Torri Michalski Richardson, PA    ALLERGIES    Allergies[3]    FAMILY HISTORY    Family History[4]    SOCIAL HISTORY  Social History[5]    I reviewed indications for follow-up and ED precautions including worsening shortness of breath, prolonged fevers, or any atypical symptoms.     The patient was given an opportunity to ask questions about the treatment plan, and all questions were addressed to the best of my ability with the information available.  Patient stable at the time of discharge          Note - This record has been created using [employer]. Chart creation errors have been sought, but may not always have been located. Such creation errors do not reflect on the standard of medical care.     Ozell JONELLE Pick, PA           [1]   Past Medical History:  Diagnosis Date    Acne     Allergic     Eczema     Migraine headache     POTS (postural orthostatic tachycardia syndrome) 06/29/2020    Strep throat     Yeast infection    [2]   Past Surgical History:  Procedure Laterality Date    MANDIBLE FRACTURE SURGERY      NASAL SEPTUM SURGERY      TONSILLECTOMY     [3]   Allergies  Allergen Reactions    Cefprozil Hives    Sulfamethoxazole-Trimethoprim Hives    Clindamycin Rash    Lorazepam Other (See Comments)     Other reaction(s): Other (See Comments)  Other Reaction: INCREASES AGITATION  Other reaction(s): Other (See Comments)  Other Reaction: INCREASES AGITATION  Other Reaction: INCREASES AGITATION  Other Reaction: INCREASES AGITATION      Sulfa (Sulfonamide Antibiotics) Rash   [4]   Family History  Problem Relation Age of Onset    No Known Problems Mother     Multiple sclerosis Father     Cancer Maternal Uncle         Colon    Stroke Maternal Grandmother     Melanoma Neg Hx     Basal cell carcinoma Neg Hx     Squamous cell carcinoma Neg Hx    [5]   Social History  Socioeconomic History    Marital status: Single     Spouse name: None    Number of children: None    Years of education: None    Highest education level: None   Tobacco Use    Smoking status: Never    Smokeless tobacco: Never   Substance and Sexual Activity    Alcohol use: Yes     Alcohol/week: 0.0 standard drinks of alcohol     Comment: occ    Drug use: No    Sexual activity: Yes     Partners: Male     Birth control/protection:  OCP     Comment: same partner x 3 years    Other Topics Concern    Do you use sunscreen? Yes    Tanning bed use? No    Are you easily burned? Yes    Excessive sun exposure? No    Blistering sunburns? No     Social Drivers of Health     Food Insecurity: Patient Declined (09/16/2023)    Received from [facility]    Hunger Vital Sign     Within the past 12 months, you worried that your food would run out before you got the money to buy more.: Patient declined     Within the past 12 months, the food you bought just didn't last and you didn't have money to get more.: Patient declined   Tobacco Use: Low Risk (08/15/2024)    Patient History     Smoking Tobacco Use: Never     Smokeless Tobacco Use: Never   Transportation Needs: Patient Declined (09/16/2023)    Received from [facility] - Transportation     In the past 12 months, has lack of transportation kept you from medical appointments or from getting medications?: Patient declined     Lack of Transportation (Non-Medical): Patient declined   Housing: Unknown (05/26/2024)    Received from [facility]    Housing Stability Vital Sign     In the last 12 months, was there a time when you were not able to pay the mortgage or rent on time?: Patient declined     At any time in the past 12 months, were you homeless or living in a shelter (including now)?: No   Utilities: Patient Declined (09/16/2023)    Received from [facility] Utilities     Threatened with loss of utilities: Patient declined   Interpersonal Safety: Not At Risk (08/15/2024)    Interpersonal Safety     Unsafe Where You Currently Live: No     Physically Hurt by Anyone: No     Abused by Anyone: No   Financial Resource Strain: Patient Declined (09/16/2023)    Received from [facility]    Overall Financial Resource Strain (CARDIA)     Difficulty of Paying Living Expenses: Patient declined

## 2024-08-18 MED FILL — HADLIMA(CF) 40 MG/0.4 ML SUBCUTANEOUS SYRINGE: SUBCUTANEOUS | 28 days supply | Qty: 1.6 | Fill #5

## 2024-09-15 NOTE — Progress Notes (Signed)
 [facility] Specialty and Home Delivery Pharmacy Refill Coordination Note    Specialty Medication(s) to be Shipped:   Inflammatory Disorders: Hadlima     Other medication(s) to be shipped: No additional medications requested for fill at this time    Specialty Medications not needed at this time: N/A     Teresa Butler, DOB: Jun 30, 1991  Phone: There are no phone numbers on file.      All above HIPAA information was verified with patient.     Was a nurse, learning disability used for this call? No    Completed refill call assessment today to schedule patient's medication shipment from the [facility] and Home Delivery Pharmacy  [phone]).  All relevant notes have been reviewed.     Specialty medication(s) and dose(s) confirmed: Regimen is correct and unchanged.   Changes to medications: Teresa Butler reports no changes at this time.  Changes to insurance: No  New side effects reported not previously addressed with a pharmacist or physician: None reported  Questions for the pharmacist: No    Confirmed patient received a Conservation Officer, Historic Buildings and a Surveyor, Mining with first shipment. The patient will receive a drug information handout for each medication shipped and additional FDA Medication Guides as required.       DISEASE/MEDICATION-SPECIFIC INFORMATION        N/A    SPECIALTY MEDICATION ADHERENCE     Medication Adherence    Specialty Medication: Halima 40mg /0.32ml  Patient is on additional specialty medications: No  Patient is on more than two specialty medications: No              Were doses missed due to medication being on hold? No    Hadlima  40/0.4 mg/ml: 1 doses of medicine on hand     Specialty medication is an injection or given on a cycle: Yes, Next injection is scheduled for 12/17.    REFERRAL TO PHARMACIST     Referral to the pharmacist: Not needed      [facility]     Shipping address confirmed in Epic.     Cost and Payment: Patient has a $0 copay, payment information is not required.    Delivery Scheduled: Yes, Expected medication delivery date: 12/19.     Medication will be delivered via Next Day Courier to the prescription address in Epic WAM.    Teresa Butler   [facility] Specialty and Home Delivery Pharmacy  Specialty Technician

## 2024-09-17 MED FILL — HADLIMA(CF) 40 MG/0.4 ML SUBCUTANEOUS SYRINGE: SUBCUTANEOUS | 28 days supply | Qty: 1.6 | Fill #6

## 2024-10-09 NOTE — Progress Notes (Signed)
 [facility] Refill Coordination Note    Specialty Medication(s) to be Shipped:   Inflammatory Disorders: Hadlima     Other medication(s) to be shipped: No additional medications requested for fill at this time    Specialty Medications not needed at this time: N/A     Teresa Butler, DOB: 01-28-1991  Phone: There are no phone numbers on file.      All above HIPAA information was verified with patient.     Was a nurse, learning disability used for this call? No    Completed refill call assessment today to schedule patient's medication shipment from the [facility]  [phone]).  All relevant notes have been reviewed.     Specialty medication(s) and dose(s) confirmed: Regimen is correct and unchanged.   Changes to medications: Chaye reports no changes at this time.  Changes to insurance: No  New side effects reported not previously addressed with a pharmacist or physician: None reported  Questions for the pharmacist: No    Confirmed patient received a Conservation Officer, Historic Buildings and a Surveyor, Mining with first shipment. The patient will receive a drug information handout for each medication shipped and additional FDA Medication Guides as required.       DISEASE/MEDICATION-SPECIFIC INFORMATION        N/A    SPECIALTY MEDICATION ADHERENCE     Medication Adherence    Patient reported X missed doses in the last month: 0  Specialty Medication: HADLIMA (CF) 40 mg/0.4 mL Syrg (adalimumab -bwwd)  Patient is on additional specialty medications: No  Informant: patient              Were doses missed due to medication being on hold? No    HADLIMA (CF) 40 mg/0.4 mL Syrg (adalimumab -bwwd): 1 days of medicine on hand       Specialty medication is an injection or given on a cycle: Yes, Next injection is scheduled for 1/14.    REFERRAL TO PHARMACIST     Referral to the pharmacist: Not needed      [facility]     Shipping address confirmed in Epic.     Cost and Payment: Patient has a $0 copay, payment information is not required.    Delivery Scheduled: Yes, Expected medication delivery date: 1/13.     Medication will be delivered via Next Day Courier to the prescription address in Epic WAM.    Jeoffrey JAYSON [facility]

## 2024-10-12 MED FILL — HADLIMA(CF) 40 MG/0.4 ML SUBCUTANEOUS SYRINGE: SUBCUTANEOUS | 28 days supply | Qty: 1.6 | Fill #7
